# Patient Record
Sex: Female | Born: 1990 | Hispanic: Yes | Smoking: Former smoker
Health system: Southern US, Community
[De-identification: ages and names within clinical notes are randomized; demographics above are authoritative.]

## PROBLEM LIST (undated history)

## (undated) DIAGNOSIS — F419 Anxiety disorder, unspecified: Secondary | ICD-10-CM

## (undated) DIAGNOSIS — F32A Depression, unspecified: Secondary | ICD-10-CM

## (undated) DIAGNOSIS — J45909 Unspecified asthma, uncomplicated: Secondary | ICD-10-CM

## (undated) HISTORY — DX: Depression, unspecified: F32.A

## (undated) HISTORY — PX: FEMUR FRACTURE SURGERY: SHX633

## (undated) HISTORY — PX: THERAPEUTIC ABORTION: SHX798

## (undated) HISTORY — PX: INDUCED ABORTION: SHX677

## (undated) HISTORY — PX: WISDOM TOOTH EXTRACTION: SHX21

## (undated) HISTORY — DX: Unspecified asthma, uncomplicated: J45.909

## (undated) HISTORY — DX: Anxiety disorder, unspecified: F41.9

---
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
## 2008-01-21 ENCOUNTER — Ambulatory Visit: Payer: Self-pay | Admitting: Obstetrics & Gynecology

## 2008-02-18 ENCOUNTER — Ambulatory Visit (HOSPITAL_COMMUNITY): Admission: RE | Admit: 2008-02-18 | Discharge: 2008-02-18 | Payer: Self-pay | Admitting: Obstetrics and Gynecology

## 2008-02-18 ENCOUNTER — Ambulatory Visit: Payer: Self-pay | Admitting: Obstetrics & Gynecology

## 2008-03-31 ENCOUNTER — Ambulatory Visit: Payer: Self-pay | Admitting: Obstetrics & Gynecology

## 2008-03-31 LAB — CONVERTED CEMR LAB: Trich, Wet Prep: NONE SEEN

## 2008-04-14 ENCOUNTER — Ambulatory Visit: Payer: Self-pay | Admitting: Obstetrics & Gynecology

## 2008-04-14 LAB — CONVERTED CEMR LAB
Hemoglobin: 10.8 g/dL — ABNORMAL LOW (ref 12.0–15.0)
MCHC: 32.5 g/dL (ref 30.0–36.0)
RDW: 14.9 % (ref 11.5–15.5)

## 2008-04-16 ENCOUNTER — Ambulatory Visit (HOSPITAL_COMMUNITY): Admission: RE | Admit: 2008-04-16 | Discharge: 2008-04-16 | Payer: Self-pay | Admitting: Family Medicine

## 2008-04-28 ENCOUNTER — Ambulatory Visit: Payer: Self-pay | Admitting: Obstetrics & Gynecology

## 2008-05-12 ENCOUNTER — Ambulatory Visit: Payer: Self-pay | Admitting: Obstetrics & Gynecology

## 2008-05-26 ENCOUNTER — Ambulatory Visit: Payer: Self-pay | Admitting: Family Medicine

## 2008-06-09 ENCOUNTER — Encounter: Payer: Self-pay | Admitting: Obstetrics and Gynecology

## 2008-06-09 ENCOUNTER — Ambulatory Visit: Payer: Self-pay | Admitting: Obstetrics & Gynecology

## 2008-06-11 ENCOUNTER — Ambulatory Visit (HOSPITAL_COMMUNITY): Admission: RE | Admit: 2008-06-11 | Discharge: 2008-06-11 | Payer: Self-pay | Admitting: Obstetrics & Gynecology

## 2008-06-23 ENCOUNTER — Ambulatory Visit: Payer: Self-pay | Admitting: Obstetrics & Gynecology

## 2008-06-23 LAB — CONVERTED CEMR LAB: Chlamydia, DNA Probe: NEGATIVE

## 2008-06-24 ENCOUNTER — Encounter: Payer: Self-pay | Admitting: Obstetrics & Gynecology

## 2008-06-30 ENCOUNTER — Ambulatory Visit: Payer: Self-pay | Admitting: Obstetrics & Gynecology

## 2008-07-01 ENCOUNTER — Ambulatory Visit (HOSPITAL_COMMUNITY): Admission: RE | Admit: 2008-07-01 | Discharge: 2008-07-01 | Payer: Self-pay | Admitting: Obstetrics & Gynecology

## 2008-07-07 ENCOUNTER — Encounter: Payer: Self-pay | Admitting: Obstetrics and Gynecology

## 2008-07-07 ENCOUNTER — Ambulatory Visit: Payer: Self-pay | Admitting: Obstetrics & Gynecology

## 2008-07-17 ENCOUNTER — Ambulatory Visit: Payer: Self-pay | Admitting: Obstetrics & Gynecology

## 2008-07-19 ENCOUNTER — Inpatient Hospital Stay (HOSPITAL_COMMUNITY): Admission: AD | Admit: 2008-07-19 | Discharge: 2008-07-19 | Payer: Self-pay | Admitting: Obstetrics & Gynecology

## 2008-07-19 ENCOUNTER — Ambulatory Visit: Payer: Self-pay | Admitting: Family

## 2008-07-20 ENCOUNTER — Inpatient Hospital Stay (HOSPITAL_COMMUNITY): Admission: AD | Admit: 2008-07-20 | Discharge: 2008-07-20 | Payer: Self-pay | Admitting: Obstetrics & Gynecology

## 2008-07-21 ENCOUNTER — Ambulatory Visit: Payer: Self-pay | Admitting: Obstetrics & Gynecology

## 2008-07-21 ENCOUNTER — Inpatient Hospital Stay (HOSPITAL_COMMUNITY): Admission: AD | Admit: 2008-07-21 | Discharge: 2008-07-25 | Payer: Self-pay | Admitting: Obstetrics & Gynecology

## 2008-07-22 ENCOUNTER — Encounter: Payer: Self-pay | Admitting: Obstetrics & Gynecology

## 2010-01-10 ENCOUNTER — Emergency Department (HOSPITAL_COMMUNITY): Admission: EM | Admit: 2010-01-10 | Discharge: 2010-01-11 | Payer: Self-pay | Admitting: Emergency Medicine

## 2010-04-27 LAB — URINALYSIS, ROUTINE W REFLEX MICROSCOPIC
Glucose, UA: NEGATIVE mg/dL
Hgb urine dipstick: NEGATIVE
Ketones, ur: NEGATIVE mg/dL
Nitrite: NEGATIVE
Protein, ur: NEGATIVE mg/dL
Specific Gravity, Urine: 1.033 — ABNORMAL HIGH (ref 1.005–1.030)
Urobilinogen, UA: 0.2 mg/dL (ref 0.0–1.0)
pH: 5.5 (ref 5.0–8.0)

## 2010-04-27 LAB — POCT PREGNANCY, URINE: Preg Test, Ur: NEGATIVE

## 2010-05-24 LAB — POCT URINALYSIS DIP (DEVICE)
Hgb urine dipstick: NEGATIVE
Ketones, ur: NEGATIVE mg/dL
Protein, ur: 30 mg/dL — AB
Specific Gravity, Urine: 1.02 (ref 1.005–1.030)
Urobilinogen, UA: 0.2 mg/dL (ref 0.0–1.0)
pH: 6.5 (ref 5.0–8.0)

## 2010-05-24 LAB — RPR: RPR Ser Ql: NONREACTIVE

## 2010-05-24 LAB — CBC
HCT: 34.9 % — ABNORMAL LOW (ref 36.0–46.0)
Hemoglobin: 8.6 g/dL — ABNORMAL LOW (ref 12.0–15.0)
MCHC: 33.8 g/dL (ref 30.0–36.0)
MCHC: 34.5 g/dL (ref 30.0–36.0)
MCV: 83.4 fL (ref 78.0–100.0)
Platelets: 138 10*3/uL — ABNORMAL LOW (ref 150–400)
Platelets: 192 10*3/uL (ref 150–400)
RDW: 14.6 % (ref 11.5–15.5)
RDW: 14.8 % (ref 11.5–15.5)
WBC: 10.6 10*3/uL — ABNORMAL HIGH (ref 4.0–10.5)

## 2010-05-25 LAB — POCT URINALYSIS DIP (DEVICE)
Bilirubin Urine: NEGATIVE
Glucose, UA: NEGATIVE mg/dL
Glucose, UA: NEGATIVE mg/dL
Hgb urine dipstick: NEGATIVE
Nitrite: NEGATIVE
Nitrite: NEGATIVE
Protein, ur: 30 mg/dL — AB
Protein, ur: NEGATIVE mg/dL
Urobilinogen, UA: 0.2 mg/dL (ref 0.0–1.0)
Urobilinogen, UA: 0.2 mg/dL (ref 0.0–1.0)
pH: 6.5 (ref 5.0–8.0)

## 2010-05-26 LAB — POCT URINALYSIS DIP (DEVICE)
Bilirubin Urine: NEGATIVE
Glucose, UA: NEGATIVE mg/dL
Glucose, UA: NEGATIVE mg/dL
Nitrite: NEGATIVE
Nitrite: NEGATIVE
Urobilinogen, UA: 0.2 mg/dL (ref 0.0–1.0)

## 2010-05-27 LAB — POCT URINALYSIS DIP (DEVICE)
Bilirubin Urine: NEGATIVE
Bilirubin Urine: NEGATIVE
Glucose, UA: NEGATIVE mg/dL
Glucose, UA: NEGATIVE mg/dL
Glucose, UA: NEGATIVE mg/dL
Hgb urine dipstick: NEGATIVE
Ketones, ur: NEGATIVE mg/dL
Nitrite: NEGATIVE
Protein, ur: 30 mg/dL — AB
Specific Gravity, Urine: 1.02 (ref 1.005–1.030)
Urobilinogen, UA: 0.2 mg/dL (ref 0.0–1.0)

## 2010-05-31 LAB — POCT URINALYSIS DIP (DEVICE)
Bilirubin Urine: NEGATIVE
Ketones, ur: NEGATIVE mg/dL
pH: 5.5 (ref 5.0–8.0)

## 2010-06-01 LAB — POCT URINALYSIS DIP (DEVICE)
Glucose, UA: NEGATIVE mg/dL
Ketones, ur: NEGATIVE mg/dL
Protein, ur: 30 mg/dL — AB
Specific Gravity, Urine: 1.015 (ref 1.005–1.030)

## 2010-06-29 NOTE — Op Note (Signed)
NAMEJELENA, Dawn Richmond                ACCOUNT NO.:  [id number]   MEDICAL RECORD NO.:  [id number]           PATIENT TYPE:   LOCATION:                                 FACILITY:   PHYSICIAN:  Dawn Richmond, M.D. DATE OF BIRTH:  1990-11-18   DATE OF PROCEDURE:  07/22/2008  DATE OF DISCHARGE:                               OPERATIVE REPORT   PREOPERATIVE DIAGNOSES:  1. Intrauterine pregnancy at 22-4/7 weeks' gestational age.  2. Arrest of descent.   POSTOPERATIVE DIAGNOSES:  1. Intrauterine pregnancy at 22-4/7 weeks' gestational age.  2. Arrest of descent.  3. Chorioamnionitis.   PROCEDURE:  Primary low transverse cesarean section.   SURGEON:  Dawn Richmond, M.D.   ASSISTANTS:  Dawn Sera, DO and Dr. Londell Richmond.   INDICATIONS FOR PROCEDURE:  [name] is a 20 year old gravida 1  now para 1-0-0-1, who was admitted in the early morning hours of July 22, 2008, in active labor.  Her labor progressed quite slowly and was  remarkable for an extended period of time in latent labor.  Her labor  eventually picked up and she dilated to complete and complete, and after  over 2-1/2 hours of pushing, she was unable to push the baby passed +2  station.  The patient was counseled on the risks and benefits of vacuum  versus cesarean section and the patient desired to proceed with a  cesarean section.  The patient was counseled on the risks of this  procedure to include but not limited to bleeding, infection, and damage  to internal organs.  The patient voiced understanding of these risks and  desired to proceed.   DESCRIPTION OF PROCEDURE:  The patient was taken to the operating room  where her epidural anesthesia was free bolus.  She was placed in the  left dorsal supine position and prepped and draped in the usual sterile  manner.  A time-out was conducted.  Appropriate anesthesia was  confirmed.  Pfannenstiel incision was made in the skin and continued  through the  subcutaneous layers down to the fascia.  The fascia was then  incised in midline.  The fascial incision was extended using manual  traction.  The fascia was then bluntly and sharply dissected off the  underlying rectus muscles.  The rectus muscles were then entered bluntly  and separated with manual traction.  The peritoneum was then entered  bluntly and narrow opening also was separated with manual traction.  Appropriate opening to the uterus was obtained.  The bladder blade was  placed.  A transverse incision was made with a scalpel in the lower  uterine segment, extended through the myometrial layers till clear  amniotic fluid was noted.  The uterine incision was then extended  laterally using manual traction.  The fetal head was grasped, was well  engaging the pelvis, and with the assistance of a vaginal operator, the  head was elevated out of the pelvis and delivered through the uterine  incision with the assistance of fundal pressure.  The mouth and nares  were bulb suctioned, then the shoulders,  followed by rest of corpus were  delivered without difficulty.  There was a spontaneous cry and good  color and good tone.  The mouth and nares were bulb suctioned again.  The cord was clamped and cut and the baby handed to the awaiting NICU  staff.  The placenta was then delivered from the uterus with the  assistance of fundal massage.  The uterus was then cleared of clots and  debris using a dry lap sponge.  The uterine incision was closed in 2-  layer closure using 0-Vicryl.  The first layer was a running  interlocking layer.  The second layer was an imbricating layer.  Of  note, the uterus was exteriorized during this closure.  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  However, there was no evidence of any uterine  septum and no evidence of any uterine anomaly during our inspection.  I  repeat, there is no evidence of a  bicornate uterus per our inspection.  Good hemostasis was noted of the uterine closure and the uterus was then  returned to the abdomen.  The abdomen was then irrigated with sterile  water.  Good hemostasis was again noted.  The peritoneum was then closed  using 2-0 Vicryl in a running non-interlocking fashion.  The fascia was  then closed using 0-Vicryl in a running non-interlocking fashion.  Subcutaneous tissues were irrigated with a wet lap sponge and bleeding  was controlled with electrocautery.  The skin was then closed with  staples in the usual manner.  All sponge, instrument, and needle counts  were correct x2.  A pressure dressing was applied.   ANESTHESIA:  Epidural.   FINDINGS:  1. Clear amniotic fluid.  2. Viable female infant.  3. Normal uterus--no evidence of bicornuate uterus or septum.   SPECIMEN:  Placenta.   DISPOSITION:  To Pathology.   ESTIMATED BLOOD LOSS:  1000 mL.   There were no immediate complications.  The patient was taken to the  PACU in good condition.      Dawn Sera, DO  Electronically Signed     ______________________________  Dawn Richmond, M.D.    MC/MEDQ  D:  07/22/2008  T:  07/23/2008  Job:  [id number]

## 2010-06-29 NOTE — Discharge Summary (Signed)
NAMEMARAYAH, Dawn Richmond                ACCOUNT NO.:  [id number]   MEDICAL RECORD NO.:  [id number]          PATIENT TYPE:  WOC   LOCATION:  WOC                          FACILITY:  [facility]   PHYSICIAN:  Dawn Richmond, M.D. DATE OF BIRTH:  [birth year]   DATE OF ADMISSION:  07/21/2008  DATE OF DISCHARGE:  07/25/2008                               DISCHARGE SUMMARY   PRIMARY DIAGNOSES:  1. Intrauterine pregnancy at 40 weeks and 4 days.  2. Status post low-transverse cesarean section, primary.  3. Anemia, mild.   SECONDARY DIAGNOSIS:  Remote history of asthma.   DIAGNOSTIC STUDIES:  Prenatal labs:  O positive, antibody negative,  rubella immune, hepatitis B surface antigen negative, RPR nonreactive,  HIV negative, gonorrhea and chlamydia negative x2, GBS negative.  On Jun 23, 2008, 1 hour glucose of 69.  Postpartum hemoglobin 8.6.   HOSPITAL COURSE:  [name] is a 20 year old G1, P1-0-0-1 who  presented at 55 and 3 weeks with onset of spontaneous labor.  She was  found to be 3, 70, and -2 on exam.  On admission, she had had  uncomplicated prenatal course.  She was thought to have a bicornuate  uterus, although this is not noted during her cesarean section.  She  progressed well, had Pitocin augmentation, and AROM was performed.  An  IUPC was placed during her labor.  She was fully dilated but pushed for  2 hours 15 minutes and still at the +2 station.  She was offered vacuum  but declined and opted for C-section instead.  Risks and benefits were  discussed with the patient.  The patient went to section for arrest of  descent.  During section, she delivered a healthy female infant.  Her  placenta was sent to Pathology.  She was not found to have a bicornuate  uterus on inspection.  Infant Apgars were 8 and 9.  The infant and  mother were stable and sent to [facility] unit.  She did well  postpartum.  Her staples are to be removed prior to discharge.  She has  been eating,  ambulating, and going to bathroom normally.  Her lochia is  less than menses, and her pain is well controlled.  She will be  discharged home today on postpartum day #3 with followup with the health  department in 6 weeks and with Depo prior to discharge.   DISCHARGE MEDICATIONS:  1. Iron sulfate 325 mg 1 p.o. b.i.d.  2. Colace 100 mg p.o. b.i.d.  3. Percocet 5/325 1 p.o. q.4 h. p.r.n. pain.  4. Ibuprofen 600 mg p.o. q.6 h. p.r.n. pain.  5. Depo-Provera 150 mg IM once prior to discharge.   DISCHARGE INSTRUCTIONS:  DISPOSITION:  Discharged home.  DIET:  Regular.  ACTIVITY:  No intercourse for 6 weeks, otherwise unlimited.  FOLLOWUP:  With the health department in 6 weeks.  Call for increasing  pain, bleeding, drainage from incision site, erythema around incision  site, headaches, vision, changes, shortness of breath, lightheadedness,  chest pain, or foul-smelling discharge.     ______________________________  Obstetrics Resident  Dawn Richmond, M.D.  Electronically Signed    OR/MEDQ  D:  07/25/2008  T:  07/25/2008  Job:  [id number]

## 2010-11-16 LAB — HCG, QUANTITATIVE, PREGNANCY: hCG, Beta Chain, Quant, S: 50422 — ABNORMAL HIGH

## 2010-11-16 LAB — WET PREP, GENITAL
Clue Cells Wet Prep HPF POC: NONE SEEN
Trich, Wet Prep: NONE SEEN

## 2010-11-16 LAB — CBC
HCT: 36.2
Hemoglobin: 11.9 — ABNORMAL LOW
MCV: 81.7
Platelets: 330
WBC: 7.9

## 2010-11-16 LAB — PREGNANCY, URINE: Preg Test, Ur: POSITIVE

## 2010-11-16 LAB — URINALYSIS, ROUTINE W REFLEX MICROSCOPIC
Bilirubin Urine: NEGATIVE
Hgb urine dipstick: NEGATIVE
Ketones, ur: 15 — AB
Nitrite: NEGATIVE
Nitrite: NEGATIVE
Specific Gravity, Urine: 1.031 — ABNORMAL HIGH
Urobilinogen, UA: 0.2
pH: 6
pH: 7

## 2010-11-16 LAB — GC/CHLAMYDIA PROBE AMP, GENITAL: GC Probe Amp, Genital: NEGATIVE

## 2010-11-16 LAB — RPR: RPR Ser Ql: NONREACTIVE

## 2010-11-19 LAB — POCT URINALYSIS DIP (DEVICE)
Bilirubin Urine: NEGATIVE
Hgb urine dipstick: NEGATIVE
Protein, ur: 100 mg/dL — AB
pH: 6 (ref 5.0–8.0)

## 2011-09-13 ENCOUNTER — Encounter (HOSPITAL_COMMUNITY): Payer: Self-pay | Admitting: Emergency Medicine

## 2011-09-13 DIAGNOSIS — O2690 Pregnancy related conditions, unspecified, unspecified trimester: Secondary | ICD-10-CM | POA: Insufficient documentation

## 2011-09-13 LAB — CBC WITH DIFFERENTIAL/PLATELET
Basophils Relative: 0 % (ref 0–1)
Eosinophils Absolute: 0.1 10*3/uL (ref 0.0–0.7)
Eosinophils Relative: 2 % (ref 0–5)
HCT: 36.1 % (ref 36.0–46.0)
Hemoglobin: 12.1 g/dL (ref 12.0–15.0)
Lymphs Abs: 3 10*3/uL (ref 0.7–4.0)
MCH: 26.6 pg (ref 26.0–34.0)
MCHC: 33.5 g/dL (ref 30.0–36.0)
MCV: 79.3 fL (ref 78.0–100.0)
Monocytes Absolute: 0.8 10*3/uL (ref 0.1–1.0)
Monocytes Relative: 10 % (ref 3–12)
Neutrophils Relative %: 50 % (ref 43–77)
RBC: 4.55 MIL/uL (ref 3.87–5.11)

## 2011-09-13 LAB — BASIC METABOLIC PANEL
BUN: 6 mg/dL (ref 6–23)
Creatinine, Ser: 0.54 mg/dL (ref 0.50–1.10)
GFR calc Af Amer: >90 mL/min (ref >90)
GFR calc non Af Amer: >90 mL/min (ref >90)
Glucose, Bld: 93 mg/dL (ref 70–99)
Potassium: 4.2 mEq/L (ref 3.5–5.1)

## 2011-09-13 NOTE — ED Notes (Addendum)
PT. REPORTS VAGINAL PAIN / LOW ABDOMINAL CRAMPING WITH  BROWNISH VAGINAL DISCHARGE TODAY , CONCERNED ABOUT MISCARRIAGE - LMP - June 18 , 2013 .

## 2011-09-14 ENCOUNTER — Emergency Department (HOSPITAL_COMMUNITY)
Admission: EM | Admit: 2011-09-14 | Discharge: 2011-09-14 | Disposition: A | Discharge status: Home / Self Care | Payer: Medicaid Other | Attending: Emergency Medicine | Admitting: Emergency Medicine

## 2011-09-14 ENCOUNTER — Emergency Department (HOSPITAL_COMMUNITY): Payer: Medicaid Other

## 2011-09-14 DIAGNOSIS — O209 Hemorrhage in early pregnancy, unspecified: Secondary | ICD-10-CM

## 2011-09-14 LAB — URINALYSIS, ROUTINE W REFLEX MICROSCOPIC
Bilirubin Urine: NEGATIVE
Glucose, UA: NEGATIVE mg/dL
Ketones, ur: NEGATIVE mg/dL
Nitrite: NEGATIVE
Specific Gravity, Urine: 1.02 (ref 1.005–1.030)
pH: 6 (ref 5.0–8.0)

## 2011-09-14 LAB — URINE MICROSCOPIC-ADD ON

## 2011-09-14 LAB — ABO/RH

## 2011-09-14 LAB — HCG, QUANTITATIVE, PREGNANCY: hCG, Beta Chain, Quant, S: 29843 m[IU]/mL — ABNORMAL HIGH (ref <5)

## 2011-09-14 MED ORDER — PRENATAL COMPLETE 14-0.4 MG PO TABS: 1.0000 | ORAL_TABLET | Freq: Every day | ORAL | Status: DC

## 2011-09-14 NOTE — ED Notes (Signed)
Dr. Norlene Campbell cancelled wet prep. Clicked off wrong procedure.

## 2011-09-14 NOTE — ED Notes (Signed)
MD at bedside. 

## 2011-09-15 LAB — GC/CHLAMYDIA PROBE AMP, GENITAL: Chlamydia, DNA Probe: NEGATIVE

## 2011-09-19 NOTE — ED Provider Notes (Signed)
History     CSN: [id number]  Arrival date & time 09/13/11  2114   First MD Initiated Contact with Patient 09/14/11 0056      Chief Complaint  Patient presents with  . Vaginal Discharge    (Consider location/radiation/quality/duration/timing/severity/associated sxs/prior treatment) HPI 21 yo female g2p1 at approximately 6 weeks with lmp of 6/18.  Pt reports she had high risk first pregnancy due to being misdiagnosed with bicornate uterus.  Pt has fibroid, found at c/s with first pregnancy.  She has had brownish discharge and low back pain since yesterday.  She is concerned about miscarriage.  Pt has not yet seen ob this pregnancy.   History reviewed. No pertinent past medical history.  History reviewed. No pertinent past surgical history.  No family history on file.  History  Substance Use Topics  . Smoking status: Never Smoker   . Smokeless tobacco: Not on file  . Alcohol Use: No    OB History    Grav Para Term Preterm Abortions TAB SAB Ect Mult Living                  Review of Systems  All other systems reviewed and are negative.    Allergies  Review of patient's allergies indicates no known allergies.  Home Medications   Current Outpatient Rx  Name Route Sig Dispense Refill  . PRENATAL COMPLETE 14-0.4 MG PO TABS Oral Take 1 tablet by mouth daily. 60 each 0    BP 107/71  Pulse 65  Temp 98.1 F (36.7 C) (Oral)  Resp 16  SpO2 99%  LMP 08/02/2011  Physical Exam  Nursing note and vitals reviewed. Constitutional: She is oriented to person, place, and time. She appears well-developed and well-nourished.  HENT:  Head: Normocephalic and atraumatic.  Nose: Nose normal.  Mouth/Throat: Oropharynx is clear and moist.  Eyes: Conjunctivae and EOM are normal. Pupils are equal, round, and reactive to light.  Neck: Normal range of motion. Neck supple. No JVD present. No tracheal deviation present. No thyromegaly present.  Cardiovascular: Normal rate, regular  rhythm, normal heart sounds and intact distal pulses.  Exam reveals no gallop and no friction rub.   No murmur heard. Pulmonary/Chest: Effort normal and breath sounds normal. No stridor. No respiratory distress. She has no wheezes. She has no rales. She exhibits no tenderness.  Abdominal: Soft. Bowel sounds are normal. She exhibits no distension and no mass. There is no tenderness. There is no rebound and no guarding.  Genitourinary: Vaginal discharge (brown d/c) found.       Os bluish,appears closed, dark brown discharge with some mucus.  Digital exam with slight dilation to cervix  Musculoskeletal: Normal range of motion. She exhibits no edema and no tenderness.  Lymphadenopathy:    She has no cervical adenopathy.  Neurological: She is alert and oriented to person, place, and time. She exhibits normal muscle tone. Coordination normal.  Skin: Skin is dry. No rash noted. No erythema. No pallor.  Psychiatric: She has a normal mood and affect. Her behavior is normal. Judgment and thought content normal.    ED Course  Procedures (including critical care time)  Results for orders placed during the hospital encounter of 09/14/11  URINALYSIS, ROUTINE W REFLEX MICROSCOPIC      Component Value Range   Color, Urine YELLOW  YELLOW   APPearance CLOUDY (*) CLEAR   Specific Gravity, Urine 1.020  1.005 - 1.030   pH 6.0  5.0 - 8.0   Glucose, UA  NEGATIVE  NEGATIVE mg/dL   Hgb urine dipstick MODERATE (*) NEGATIVE   Bilirubin Urine NEGATIVE  NEGATIVE   Ketones, ur NEGATIVE  NEGATIVE mg/dL   Protein, ur NEGATIVE  NEGATIVE mg/dL   Urobilinogen, UA 0.2  0.0 - 1.0 mg/dL   Nitrite NEGATIVE  NEGATIVE   Leukocytes, UA SMALL (*) NEGATIVE  CBC WITH DIFFERENTIAL      Component Value Range   WBC 7.8  4.0 - 10.5 K/uL   RBC 4.55  3.87 - 5.11 MIL/uL   Hemoglobin 12.1  12.0 - 15.0 g/dL   HCT 16.1  09.6 - 04.5 %   MCV 79.3  78.0 - 100.0 fL   MCH 26.6  26.0 - 34.0 pg   MCHC 33.5  30.0 - 36.0 g/dL   RDW 40.9   81.1 - 91.4 %   Platelets 291  150 - 400 K/uL   Neutrophils Relative 50  43 - 77 %   Neutro Abs 3.9  1.7 - 7.7 K/uL   Lymphocytes Relative 39  12 - 46 %   Lymphs Abs 3.0  0.7 - 4.0 K/uL   Monocytes Relative 10  3 - 12 %   Monocytes Absolute 0.8  0.1 - 1.0 K/uL   Eosinophils Relative 2  0 - 5 %   Eosinophils Absolute 0.1  0.0 - 0.7 K/uL   Basophils Relative 0  0 - 1 %   Basophils Absolute 0.0  0.0 - 0.1 K/uL  BASIC METABOLIC PANEL      Component Value Range   Sodium 137  135 - 145 mEq/L   Potassium 4.2  3.5 - 5.1 mEq/L   Chloride 102  96 - 112 mEq/L   CO2 27  19 - 32 mEq/L   Glucose, Bld 93  70 - 99 mg/dL   BUN 6  6 - 23 mg/dL   Creatinine, Ser 7.82  0.50 - 1.10 mg/dL   Calcium 95.6  8.4 - 21.3 mg/dL   GFR calc non Af Amer >90  >90 mL/min   GFR calc Af Amer >90  >90 mL/min  POCT PREGNANCY, URINE      Component Value Range   Preg Test, Ur POSITIVE (*) NEGATIVE  GC/CHLAMYDIA PROBE AMP, GENITAL      Component Value Range   GC Probe Amp, Genital NEGATIVE  NEGATIVE   Chlamydia, DNA Probe NEGATIVE  NEGATIVE  HCG, QUANTITATIVE, PREGNANCY      Component Value Range   hCG, Beta Chain, Quant, S [id number] (*) <5 mIU/mL  ABO/RH      Component Value Range   ABO/RH(D) O POS     No rh immune globuloin NOT A RH IMMUNE GLOBULIN CANDIDATE, PT RH POSITIVE    URINE MICROSCOPIC-ADD ON      Component Value Range   Squamous Epithelial / LPF RARE  RARE   WBC, UA 3-6  <3 WBC/hpf   RBC / HPF 3-6  <3 RBC/hpf   Bacteria, UA MANY (*) RARE   Crystals CA OXALATE CRYSTALS (*) NEGATIVE   US Ob Comp Less 14 Wks  09/14/2011  *RADIOLOGY REPORT*  Clinical Data: Vaginal bleeding, pregnant.  OBSTETRIC <14 WK [place] AND TRANSVAGINAL OB US  Technique:  Both transabdominal and transvaginal ultrasound examinations were performed for complete evaluation of the gestation as well as the maternal uterus, adnexal regions, and pelvic cul-de-sac.  Transvaginal technique was performed to assess early pregnancy.  Comparison:   None.  Intrauterine gestational sac:  Visualized/normal in shape. Yolk sac: Identified  Embryo: Identified Cardiac Activity: Identified Heart Rate: 123 bpm  CRL: 3.4  mm  6 w  0 d        [place] EDC: 05/09/2012  Maternal uterus/adnexae: Normal sonographic appearance to the ovaries.  No significant subchorionic hemorrhage.  No free fluid.  IMPRESSION: Single intrauterine gestation with cardiac activity documented. Estimated age of 6 weeks 0 days by crown-rump length.  Original Report Authenticated By: Waneta Martins, M.D.   US Ob Transvaginal  09/14/2011  *RADIOLOGY REPORT*  Clinical Data: Vaginal bleeding, pregnant.  OBSTETRIC <14 WK [place] AND TRANSVAGINAL OB US  Technique:  Both transabdominal and transvaginal ultrasound examinations were performed for complete evaluation of the gestation as well as the maternal uterus, adnexal regions, and pelvic cul-de-sac.  Transvaginal technique was performed to assess early pregnancy.  Comparison:  None.  Intrauterine gestational sac:  Visualized/normal in shape. Yolk sac: Identified Embryo: Identified Cardiac Activity: Identified Heart Rate: 123 bpm  CRL: 3.4  mm  6 w  0 d        [place] EDC: 05/09/2012  Maternal uterus/adnexae: Normal sonographic appearance to the ovaries.  No significant subchorionic hemorrhage.  No free fluid.  IMPRESSION: Single intrauterine gestation with cardiac activity documented. Estimated age of 6 weeks 0 days by crown-rump length.  Original Report Authenticated By: Waneta Martins, M.D.      1. First trimester bleeding       MDM  20 yo female with first trimester bleeding, but viable appearing gestation on u/s.  Pt updated on findings and need for close follow up with ob        Olivia Mackie, MD 09/19/11 2127

## 2012-01-24 DIAGNOSIS — N949 Unspecified condition associated with female genital organs and menstrual cycle: Secondary | ICD-10-CM | POA: Insufficient documentation

## 2012-01-24 DIAGNOSIS — Z3202 Encounter for pregnancy test, result negative: Secondary | ICD-10-CM | POA: Insufficient documentation

## 2012-01-24 DIAGNOSIS — N72 Inflammatory disease of cervix uteri: Secondary | ICD-10-CM | POA: Insufficient documentation

## 2012-01-25 ENCOUNTER — Emergency Department (HOSPITAL_COMMUNITY)

## 2012-01-25 ENCOUNTER — Encounter (HOSPITAL_COMMUNITY): Payer: Self-pay | Admitting: *Deleted

## 2012-01-25 ENCOUNTER — Emergency Department (HOSPITAL_COMMUNITY)
Admission: EM | Admit: 2012-01-25 | Discharge: 2012-01-25 | Disposition: A | Discharge status: Home / Self Care | Attending: Emergency Medicine | Admitting: Emergency Medicine

## 2012-01-25 DIAGNOSIS — R102 Pelvic and perineal pain: Secondary | ICD-10-CM

## 2012-01-25 DIAGNOSIS — N72 Inflammatory disease of cervix uteri: Secondary | ICD-10-CM

## 2012-01-25 LAB — BASIC METABOLIC PANEL
BUN: 6 mg/dL (ref 6–23)
Calcium: 9.4 mg/dL (ref 8.4–10.5)
Creatinine, Ser: 0.66 mg/dL (ref 0.50–1.10)
GFR calc Af Amer: >90 mL/min (ref >90)
GFR calc non Af Amer: >90 mL/min (ref >90)
Glucose, Bld: 107 mg/dL — ABNORMAL HIGH (ref 70–99)
Potassium: 3.4 mEq/L — ABNORMAL LOW (ref 3.5–5.1)

## 2012-01-25 LAB — URINALYSIS, ROUTINE W REFLEX MICROSCOPIC
Bilirubin Urine: NEGATIVE
Hgb urine dipstick: NEGATIVE
Ketones, ur: NEGATIVE mg/dL
Specific Gravity, Urine: 1.004 — ABNORMAL LOW (ref 1.005–1.030)
pH: 6 (ref 5.0–8.0)

## 2012-01-25 LAB — CBC WITH DIFFERENTIAL/PLATELET
Basophils Relative: 1 % (ref 0–1)
Eosinophils Absolute: 0.2 10*3/uL (ref 0.0–0.7)
Eosinophils Relative: 3 % (ref 0–5)
Hemoglobin: 12.7 g/dL (ref 12.0–15.0)
Lymphs Abs: 2.1 10*3/uL (ref 0.7–4.0)
MCH: 25.8 pg — ABNORMAL LOW (ref 26.0–34.0)
MCHC: 33 g/dL (ref 30.0–36.0)
MCV: 78.1 fL (ref 78.0–100.0)
Monocytes Absolute: 0.7 10*3/uL (ref 0.1–1.0)
Monocytes Relative: 12 % (ref 3–12)
Neutrophils Relative %: 49 % (ref 43–77)
RBC: 4.93 MIL/uL (ref 3.87–5.11)

## 2012-01-25 LAB — PREGNANCY, URINE: Preg Test, Ur: NEGATIVE

## 2012-01-25 LAB — WET PREP, GENITAL: Clue Cells Wet Prep HPF POC: NONE SEEN

## 2012-01-25 MED ORDER — METOCLOPRAMIDE HCL 10 MG PO TABS: 10.0000 mg | ORAL_TABLET | Freq: Three times a day (TID) | ORAL | Status: DC | PRN

## 2012-01-25 MED ORDER — DOXYCYCLINE HYCLATE 100 MG PO CPEP: 100.0000 mg | ORAL_CAPSULE | Freq: Two times a day (BID) | ORAL | Status: AC

## 2012-01-25 MED ORDER — IBUPROFEN 800 MG PO TABS
800.0000 mg | ORAL_TABLET | Freq: Once | ORAL | Status: AC
  Administered 2012-01-25: 800 mg via ORAL
  Filled 2012-01-25: qty 1

## 2012-01-25 MED ORDER — SODIUM CHLORIDE 0.9 % IV SOLN
Freq: Once | INTRAVENOUS | Status: AC
  Administered 2012-01-25: 20 mL/h via INTRAVENOUS

## 2012-01-25 MED ORDER — IOHEXOL 300 MG/ML  SOLN: 100.0000 mL | Freq: Once | INTRAMUSCULAR | Status: DC | PRN

## 2012-01-25 MED ORDER — TRAMADOL HCL 50 MG PO TABS: 50.0000 mg | ORAL_TABLET | Freq: Four times a day (QID) | ORAL | Status: DC | PRN

## 2012-01-25 MED ORDER — CEFACLOR 500 MG PO CAPS: 500.0000 mg | ORAL_CAPSULE | Freq: Three times a day (TID) | ORAL | Status: AC

## 2012-01-25 MED ORDER — MORPHINE SULFATE 4 MG/ML IJ SOLN
4.0000 mg | Freq: Once | INTRAMUSCULAR | Status: AC
  Administered 2012-01-25: 4 mg via INTRAVENOUS
  Filled 2012-01-25: qty 1

## 2012-01-25 MED ORDER — METOCLOPRAMIDE HCL 5 MG/ML IJ SOLN
10.0000 mg | Freq: Once | INTRAMUSCULAR | Status: AC
  Administered 2012-01-25: 10 mg via INTRAVENOUS
  Filled 2012-01-25: qty 2

## 2012-01-25 NOTE — ED Notes (Signed)
Pt alert and oriented x4. Respirations even and unlabored, bilateral symmetrical rise and fall of chest. Skin warm and dry. In no acute distress. Denies needs.   

## 2012-01-25 NOTE — ED Notes (Signed)
Pt escorted to discharge window. Pt verbalized understanding discharge instructions. In no acute distress.  

## 2012-01-25 NOTE — ED Notes (Signed)
Pt here with c/o rt inguinal pain that began last month. Pt went to the clinic in [place] and was given a pelvic exam and cultured. Pt was placed on antibiotics, Doxycycline and Flagyl. Pt has finished the Flagyl and is still taking the Doxycycline. Pt says std panel came back negative but was told she had PID. Pt began to miscarry July and went to abortion clinic in [place] August 3rd to terminally end the pregnancy. Pt states she and her husband began to have intercourse 4-5 days after the pregnancy ended. Patient not currently taking any pain medications.

## 2012-01-25 NOTE — ED Provider Notes (Signed)
History     CSN: [id number]  Arrival date & time 01/24/12  2356   First MD Initiated Contact with Patient 01/25/12 0134      Chief Complaint  Patient presents with  . Groin Pain    (Consider location/radiation/quality/duration/timing/severity/associated sxs/prior treatment) HPI 21 year old female presents emergency apartment complaining of persistent right lower quadrant pain. She's had these symptoms ongoing for the last 2 weeks, starting 11/19.   She was seen at [facility] in [place], diagnosed with PID on 12/3. She reports she was treated with doxycycline and Flagyl. She has a few more days of doxycycline left. She reports she was called by the clinic and told that her cultures were negative for gonorrhea and Chlamydia. She denies any fevers or chills no dysuria. Patient had miscarriage in July, had D&C August 3. She has had an irregular period since that time. She had a short menstrual cycle after being seen and [facility] that lasted 3 days. She has not had intercourse with anyone since being treated. She reports the pain is constant, worse with movement.    History reviewed. No pertinent past medical history.  Past Surgical History  Procedure Date  . Induced abortion   . Therapeutic abortion august 3rd 2013    History reviewed. No pertinent family history.  History  Substance Use Topics  . Smoking status: Never Smoker   . Smokeless tobacco: Not on file  . Alcohol Use: Yes     Comment: occasional    OB History    Grav Para Term Preterm Abortions TAB SAB Ect Mult Living                  Review of Systems  See History of Present Illness; otherwise all other systems are reviewed and negative  Allergies  Review of patient's allergies indicates no known allergies.  Home Medications   Current Outpatient Rx  Name  Route  Sig  Dispense  Refill  . DOXYCYCLINE HYCLATE 100 MG PO CPEP   Oral   Take 100 mg by mouth 2 (two) times daily. For 10 days only  Stop  date:01/27/12         . METRONIDAZOLE 500 MG PO TABS   Oral   Take 500 mg by mouth 3 (three) times daily. For 7 days only    Stop date: 01/24/12           BP 117/71  Pulse 90  Temp 98.1 F (36.7 C) (Oral)  Resp 20  SpO2 100%  LMP 01/17/2012  Physical Exam  Nursing note and vitals reviewed. Constitutional: She is oriented to person, place, and time. She appears well-developed and well-nourished.  HENT:  Head: Normocephalic and atraumatic.  Nose: Nose normal.  Mouth/Throat: Oropharynx is clear and moist.  Eyes: Conjunctivae normal and EOM are normal. Pupils are equal, round, and reactive to light.  Neck: Normal range of motion. Neck supple. No JVD present. No tracheal deviation present. No thyromegaly present.  Cardiovascular: Normal rate, regular rhythm, normal heart sounds and intact distal pulses.  Exam reveals no gallop and no friction rub.   No murmur heard. Pulmonary/Chest: Effort normal and breath sounds normal. No stridor. No respiratory distress. She has no wheezes. She has no rales. She exhibits no tenderness.  Abdominal: Soft. Bowel sounds are normal. She exhibits no distension and no mass. There is tenderness (tenderness to palpation over right lower abdomen). There is no rebound and no guarding.  Genitourinary:       Yellow-green  discharge noted in vault. Patient with significant cervical motion tenderness. Right adnexal tenderness. Less tender over uterus, no adnexal tenderness on the left. No masses appreciated, external genitalia normal  Musculoskeletal: Normal range of motion. She exhibits no edema and no tenderness.  Lymphadenopathy:    She has no cervical adenopathy.  Neurological: She is oriented to person, place, and time. She has normal reflexes. No cranial nerve deficit. She exhibits normal muscle tone. Coordination normal.  Skin: Skin is warm and dry. No rash noted. No erythema. No pallor.  Psychiatric: She has a normal mood and affect. Her behavior is  normal. Judgment and thought content normal.    ED Course  Procedures (including critical care time)  Labs Reviewed  URINALYSIS, ROUTINE W REFLEX MICROSCOPIC - Abnormal; Notable for the following:    Specific Gravity, Urine 1.004 (*)     All other components within normal limits  WET PREP, GENITAL - Abnormal; Notable for the following:    WBC, Wet Prep HPF POC MODERATE (*)     All other components within normal limits  CBC WITH DIFFERENTIAL - Abnormal; Notable for the following:    MCH 25.8 (*)     Platelets 462 (*)     All other components within normal limits  BASIC METABOLIC PANEL - Abnormal; Notable for the following:    Potassium 3.4 (*)     Glucose, Bld 107 (*)     All other components within normal limits  PREGNANCY, URINE  GC/CHLAMYDIA PROBE AMP   US Transvaginal Non-ob  01/25/2012  *RADIOLOGY REPORT*  Clinical Data: Right adnexal pain.  TRANSABDOMINAL AND TRANSVAGINAL ULTRASOUND OF PELVIS Technique:  Both transabdominal and transvaginal ultrasound examinations of the pelvis were performed. Transabdominal technique was performed for global imaging of the pelvis including uterus, ovaries, adnexal regions, and pelvic cul-de-sac.  It was necessary to proceed with endovaginal exam following the transabdominal exam to visualize the ovaries and endometrium.  Comparison:  None  Findings:  Uterus: The uterus is anteverted and measures 8.2 x 4.6 x 6 cm.  No focal myometrial masses.  Cystic changes in the cervix consistent with Nabothian cysts.  Endometrium: Normal endometrial stripe thickness at 5.5 mm.  No abnormal endometrial fluid collection.  Right ovary:  The right ovary measures 3.1 x 2.5 x 2 cm.  Normal follicular changes.  Flow is demonstrated in the right ovary on color flow Doppler imaging.  Left ovary: The left ovary measures 2.8 x 1.1 x 2.4 cm.  Normal follicular changes.  Other findings: No free fluid  IMPRESSION: Normal study. No evidence of pelvic mass or other significant  abnormality.   Original Report Authenticated By: Burman Nieves, M.D.    US Pelvis Complete  01/25/2012  *RADIOLOGY REPORT*  Clinical Data: Right adnexal pain.  TRANSABDOMINAL AND TRANSVAGINAL ULTRASOUND OF PELVIS Technique:  Both transabdominal and transvaginal ultrasound examinations of the pelvis were performed. Transabdominal technique was performed for global imaging of the pelvis including uterus, ovaries, adnexal regions, and pelvic cul-de-sac.  It was necessary to proceed with endovaginal exam following the transabdominal exam to visualize the ovaries and endometrium.  Comparison:  None  Findings:  Uterus: The uterus is anteverted and measures 8.2 x 4.6 x 6 cm.  No focal myometrial masses.  Cystic changes in the cervix consistent with Nabothian cysts.  Endometrium: Normal endometrial stripe thickness at 5.5 mm.  No abnormal endometrial fluid collection.  Right ovary:  The right ovary measures 3.1 x 2.5 x 2 cm.  Normal follicular changes.  Flow is demonstrated in the right ovary on color flow Doppler imaging.  Left ovary: The left ovary measures 2.8 x 1.1 x 2.4 cm.  Normal follicular changes.  Other findings: No free fluid  IMPRESSION: Normal study. No evidence of pelvic mass or other significant abnormality.   Original Report Authenticated By: Burman Nieves, M.D.      1. Pelvic pain   2. Cervicitis       MDM  21 yo female presents to the emergency department with persistent vaginal discharge and right lower quadrant pain. Initially thought to have perhaps ovarian cyst, no abnormality noted on ultrasound. Pelvic exam with significant purulent discharge and very tender with cervical motion. Discussed case with on-call OB/GYN, Dr. Tinnie Gens who recommends CT scan, and possible transfer to women's.        Olivia Mackie, MD 01/25/12 [phone]

## 2012-01-25 NOTE — ED Notes (Signed)
Per md pt allowed to take home antibiotics

## 2012-01-25 NOTE — ED Provider Notes (Signed)
Patient given update on CT results - unremarkable.  She was resting, seemingly comfortably.  She tells me that she has f/u tomorrow.  We will also provided Gyn F/U and the patient will be d/c.   Gerhard Munch, MD 01/25/12 [phone]

## 2012-01-25 NOTE — ED Notes (Signed)
Pt back from CT

## 2012-01-25 NOTE — ED Notes (Signed)
Pt to US with tech

## 2012-01-25 NOTE — ED Notes (Signed)
md at bedside

## 2012-01-25 NOTE — ED Notes (Signed)
Pt states pelvic cramping pain began on 11/29 when she was supposed to start her cycle.  Pt stated she thought she was having another miscarriage but at [facility] the pregnancy test was negative.  Pt stated she began her cycle Dec 3 and stopped on Sunday, which is shorter than usual.  Pt states pain is increasing but she was unable to fill her perscription. Pt states she finished her Flagyl today and is still taking Doxycycline.

## 2012-01-26 LAB — GC/CHLAMYDIA PROBE AMP
CT Probe RNA: NEGATIVE
GC Probe RNA: NEGATIVE

## 2012-05-21 ENCOUNTER — Encounter: Payer: Self-pay | Admitting: Obstetrics & Gynecology

## 2012-08-02 ENCOUNTER — Ambulatory Visit (INDEPENDENT_AMBULATORY_CARE_PROVIDER_SITE_OTHER): Admitting: Obstetrics & Gynecology

## 2012-08-02 ENCOUNTER — Encounter: Payer: Self-pay | Admitting: Obstetrics & Gynecology

## 2012-08-02 ENCOUNTER — Other Ambulatory Visit: Payer: Self-pay | Admitting: *Deleted

## 2012-08-02 VITALS — BP 126/76 | HR 92 | Temp 98.8°F | Ht 59.0 in | Wt 101.0 lb

## 2012-08-02 DIAGNOSIS — N809 Endometriosis, unspecified: Secondary | ICD-10-CM

## 2012-08-02 DIAGNOSIS — N979 Female infertility, unspecified: Secondary | ICD-10-CM

## 2012-08-02 NOTE — Patient Instructions (Addendum)
Infertility WHAT IS INFERTILITY?  Infertility is usually defined as not being able to get pregnant after trying for one year of regular sexual intercourse without the use of contraceptives. Or not being able to carry a pregnancy to term and have a baby. The infertility rate in the [place] is around 10%. Pregnancy is the result of a chain of events. A woman must release an egg from one of her ovaries (ovulation). The egg must be fertilized by the female sperm. Then it travels through a fallopian tube into the uterus (womb), where it attaches to the wall of the uterus and grows. A man must have enough sperm, and the sperm must join with (fertilize) the egg along the way, at the proper time. The fertilized egg must then become attached to the inside of the uterus. While this may seem simple, many things can happen to prevent pregnancy from occurring.  WHOSE PROBLEM IS IT?  About 20% of infertility cases are due to problems with the man (female factors) and 65% are due to problems with the woman (female factors). Other cases are due to a combination of female and female factors or to unknown causes.  WHAT CAUSES INFERTILITY IN MEN?  Infertility in men is often caused by problems with making enough normal sperm or getting the sperm to reach the egg. Problems with sperm may exist from birth or develop later in life, due to illness or injury. Some men produce no sperm, or produce too few sperm (oligospermia). Other problems include:  Sexual dysfunction.  Hormonal or endocrine problems.  Age. Female fertility decreases with age, but not at as young an age as female fertility.  Infection.  Congenital problems. Birth defect, such as absence of the tubes that carry the sperm (vas deferens).  Genetic/chromosomal problems.  Antisperm antibody problems.  Retrograde ejaculation (sperm go into the bladder).  Varicoceles, spematoceles, or tumors of the testicles.  Lifestyle can influence the number and  quality of a man's sperm.  Alcohol and drugs can temporarily reduce sperm quality.  Environmental toxins, including pesticides and lead, may cause some cases of infertility in men. WHAT CAUSES INFERTILITY IN WOMEN?   Problems with ovulation account for most infertility in women. Without ovulation, eggs are not available to be fertilized.  Signs of problems with ovulation include irregular menstrual periods or no periods at all.  Simple lifestyle factors, including stress, diet, or athletic training, can affect a woman's hormonal balance.  Age. Fertility begins to decrease in women in the early 30s and is worse after age 37.  Much less often, a hormonal imbalance from a serious medical problem, such as a pituitary gland tumor, thyroid or other chronic medical disease, can cause ovulation problems.  Pelvic infections.  Polycystic ovary syndrome (increase in female hormones, unable to ovulate).  Alcohol or illegal drugs.  Environmental toxins, radiation, pesticides, and certain chemicals.  Aging is an important factor in female infertility.  The ability of a woman's ovaries to produce eggs declines with age, especially after age 35. About one third of couples where the woman is over 35 will have problems with fertility.  By the time she reaches menopause when her monthly periods stop for good, a woman can no longer produce eggs or become pregnant.  Other problems can also lead to infertility in women. If the fallopian tubes are blocked at one or both ends, the egg cannot travel through the tubes into the uterus. Scar tissue (adhesions) in the pelvis may cause blocked   tubes. This may result from pelvic inflammatory disease, endometriosis, or surgery for an ectopic pregnancy (fertilized egg implanted outside the uterus) or any pelvic or abdominal surgery causing adhesions.  Fibroid tumors or polyps of the uterus.  Congenital (birth defect) abnormalities of the uterus.  Infection of the  cervix (cervicitis).  Cervical stenosis (narrowing).  Abnormal cervical mucus.  Polycystic ovary syndrome.  Having sexual intercourse too often (every other day or 4 to 5 times a week).  Obesity.  Anorexia.  Poor nutrition.  Over exercising, with loss of body fat.  DES. Your mother received diethylstilbesterol hormone when pregnant with you. HOW IS INFERTILITY TESTED?  If you have been trying to have a baby without success, you may want to seek medical help. You should not wait for one year of trying before seeing a health care provider if:  You are over 35.  You have reason to believe that there may be a fertility problem. A medical evaluation may determine the reasons for a couple's infertility. Usually this process begins with:  Physical exams.  Medical histories of both partners.  Sexual histories of both partners. If there is no obvious problem, like improperly timed intercourse or absence of ovulation, tests may be needed.   For a man, testing usually begins with tests of his semen to look at:  The number of sperm.  The shape of sperm.  Movement of his sperm.  Taking a complete medical and surgical history.  Physical examination.  Check for infection of the female reproductive organs. Sometimes hormone tests are done.   For a woman, the first step in testing is to find out if she is ovulating each month. There are several ways to do this. For example, she can keep track of changes in her morning body temperature and in the texture of her cervical mucus. Another tool is a home ovulation test kit, which can be bought at drug or grocery stores.  Checks of ovulation can also be done in the doctor's office, using blood tests for hormone levels or ultrasound tests of the ovaries. If the woman is ovulating, more tests will need to be done. Some common female tests include:  Hysterosalpingogram: An x-ray of the fallopian tubes and uterus after they are injected with  dye. It shows if the tubes are open and shows the shape of the uterus.  Laparoscopy: An exam of the tubes and other female organs for disease. A lighted tube called a laparoscope is used to see inside the abdomen.  Endometrial biopsy: Sample of uterus tissue taken on the first day of the menstrual period, to see if the tissue indicates you are ovulating.  Transvaginal ultrasound: Examines the female organs.  Hysteroscopy: Uses a lighted tube to examine the cervix and inside the uterus, to see if there are any abnormalities inside the uterus. TREATMENT  Depending on the test results, different treatments can be suggested. The type of treatment depends on the cause. 85 to 90% of infertility cases are treated with drugs or surgery.   Various fertility drugs may be used for women with ovulation problems. It is important to talk with your caregiver about the drug to be used. You should understand the drug's benefits and side effects. Depending on the type of fertility drug and the dosage of the drug used, multiple births (twins or multiples) can occur in some women.  If needed, surgery can be done to repair damage to a woman's ovaries, fallopian tubes, cervix, or uterus.  Surgery   or medical treatment for endometriosis or polycystic ovary syndrome. Sometimes a man has an infertility problem that can be corrected with medicine or by surgery.  Intrauterine insemination (IUI) of sperm, timed with ovulation.  Change in lifestyle, if that is the cause (lose weight, increase exercise, and stop smoking, drinking excessively, or taking illegal drugs).  Other types of surgery:  Removing growths inside and on the uterus.  Removing scar tissue from inside of the uterus.  Fixing blocked tubes.  Removing scar tissue in the pelvis and around the female organs. WHAT IS ASSISTED REPRODUCTIVE TECHNOLOGY (ART)?  Assisted reproductive technology (ART) is another form of special methods used to help infertile  couples. ART involves handling both the woman's eggs and the man's sperm. Success rates vary and depend on many factors. ART can be expensive and time-consuming. But ART has made it possible for many couples to have children that otherwise would not have been conceived. Some methods are listed below:  In vitro fertilization (IVF). IVF is often used when a woman's fallopian tubes are blocked or when a man has low sperm counts. A drug is used to stimulate the ovaries to produce multiple eggs. Once mature, the eggs are removed and placed in a culture dish with the man's sperm for fertilization. After about 40 hours, the eggs are examined to see if they have become fertilized by the sperm and are dividing into cells. These fertilized eggs (embryos) are then placed in the woman's uterus. This bypasses the fallopian tubes.  Gamete intrafallopian transfer (GIFT) is similar to IVF, but used when the woman has at least one normal fallopian tube. Three to five eggs are placed in the fallopian tube, along with the man's sperm, for fertilization inside the woman's body.  Zygote intrafallopian transfer (ZIFT), also called tubal embryo transfer, combines IVF and GIFT. The eggs retrieved from the woman's ovaries are fertilized in the lab and placed in the fallopian tubes rather than in the uterus.  ART procedures sometimes involve the use of donor eggs (eggs from another woman) or previously frozen embryos. Donor eggs may be used if a woman has impaired ovaries or has a genetic disease that could be passed on to her baby.  When performing ART, you are at higher risk for resulting in multiple pregnancies, twins, triplets or more.  Intracytoplasma sperm injection is a procedure that injects a single sperm into the egg to fertilize it.  Embryo transplant is a procedure that starts after growing an embryo in a special media (chemical solution) developed to keep the embryo alive for 2 to 5 days, and then transplanting it  into the uterus. In cases where a cause cannot be found and pregnancy does not occur, adoption may be a consideration. Document Released: 02/03/2003 Document Revised: 04/25/2011 Document Reviewed: 12/30/2008 [employer] Patient Information 2014 [employer].  

## 2012-08-02 NOTE — Progress Notes (Signed)
Subjective:     Dawn Richmond is a 22 y.o. female here for a routine exam.  Current complaints: infertility consult. Pt states she has been actively trying for 7 months to become pregnant. Pt states miscarried August 2013 at about 6 weeks. Pt states she has one living child. Pt states she was diagnosis with endometriosis by her family doctor.  Personal health questionnaire reviewed: yes.   Gynecologic History Patient's last menstrual period was 07/12/2012. Contraception: none Last Pap: 2013. Results were: normal   Obstetric History OB History   Grav Para Term Preterm Abortions TAB SAB Ect Mult Living                   The following portions of the patient's history were reviewed and updated as appropriate: allergies, current medications, past family history, past medical history, past social history, past surgical history and problem list.  Review of Systems Pertinent items are noted in HPI.    Objective:   General:  alert     Abdomen: soft, non-tender; bowel sounds normal; no masses,  no organomegaly   Vulva:  normal  Vagina: normal vagina  Cervix:  no lesions  Corpus: normal size, contour, position, consistency, mobility, non-tender  Adnexa:  normal adnexa    Assessment:   Secondary infertility H/O endometriosis Not trying to conceive at this point  Plan:   Basic infertility evaluation, including an HSG Return to review test results

## 2012-08-05 DIAGNOSIS — N809 Endometriosis, unspecified: Secondary | ICD-10-CM | POA: Insufficient documentation

## 2012-08-05 DIAGNOSIS — N979 Female infertility, unspecified: Secondary | ICD-10-CM | POA: Insufficient documentation

## 2012-08-15 ENCOUNTER — Other Ambulatory Visit

## 2012-08-23 ENCOUNTER — Ambulatory Visit (HOSPITAL_COMMUNITY): Admission: RE | Admit: 2012-08-23 | Source: Ambulatory Visit

## 2012-08-24 ENCOUNTER — Ambulatory Visit (HOSPITAL_COMMUNITY)

## 2013-01-05 ENCOUNTER — Emergency Department (HOSPITAL_COMMUNITY)
Admission: EM | Admit: 2013-01-05 | Discharge: 2013-01-05 | Disposition: A | Discharge status: Home / Self Care | Attending: Emergency Medicine | Admitting: Emergency Medicine

## 2013-01-05 ENCOUNTER — Encounter (HOSPITAL_COMMUNITY): Payer: Self-pay | Admitting: Emergency Medicine

## 2013-01-05 DIAGNOSIS — R21 Rash and other nonspecific skin eruption: Secondary | ICD-10-CM | POA: Insufficient documentation

## 2013-01-05 MED ORDER — PREDNISONE 20 MG PO TABS: ORAL_TABLET | ORAL | Status: DC

## 2013-01-05 MED ORDER — HYDROXYZINE HCL 25 MG PO TABS: 25.0000 mg | ORAL_TABLET | Freq: Four times a day (QID) | ORAL | Status: DC

## 2013-01-05 NOTE — ED Notes (Signed)
Pt from home reports that she moved into new house on Wednesday and has had itchy rash since. Pt has not tried OTC meds. Pt is A&O and in NAD

## 2013-01-05 NOTE — ED Provider Notes (Signed)
CSN: [id number]     Arrival date & time 01/05/13  0741 History   First MD Initiated Contact with Patient 01/05/13 0756     Chief Complaint  Patient presents with  . Rash   (Consider location/radiation/quality/duration/timing/severity/associated sxs/prior Treatment) HPI Comments: Patient presents to the ER for evaluation of itchy rash. She reports that she moved into a new house the day before the rash began. She brought all her own furniture and benefits to the new home. When she woke up the next morning she had red bumps on her legs that were very itchy. Rash has persisted ever since. Areas and feet have scabbed over, new areas have arisen. No lesions seen to appear in the morning after awakening. Daughter has slept in the bed with her, but has eczema and has a rash all over already, can't tell if she has similar rash.  Patient is a 22 y.o. female presenting with rash.  Rash Associated symptoms: no fever and no shortness of breath     History reviewed. No pertinent past medical history. Past Surgical History  Procedure Laterality Date  . Induced abortion    . Therapeutic abortion  august 3rd 2013   Family History  Problem Relation Age of Onset  . Diabetes Mother   . Hypertension Mother   . Glaucoma Mother    History  Substance Use Topics  . Smoking status: Never Smoker   . Smokeless tobacco: Not on file  . Alcohol Use: Yes     Comment: occasional   OB History   Grav Para Term Preterm Abortions TAB SAB Ect Mult Living                 Review of Systems  Constitutional: Negative for fever.  HENT: Negative.   Respiratory: Negative for shortness of breath.   Skin: Positive for rash.  All other systems reviewed and are negative.    Allergies  Review of patient's allergies indicates no known allergies.  Home Medications   Current Outpatient Rx  Name  Route  Sig  Dispense  Refill  . metoCLOPramide (REGLAN) 10 MG tablet   Oral   Take 1 tablet (10 mg total) by mouth 3  (three) times daily as needed (nausea).   15 tablet   0   . traMADol (ULTRAM) 50 MG tablet   Oral   Take 1 tablet (50 mg total) by mouth every 6 (six) hours as needed for pain.   15 tablet   0    BP 122/84  Pulse 93  Temp(Src) 98.1 F (36.7 C) (Oral)  Resp 16  SpO2 100%  LMP 12/17/2012 Physical Exam  Constitutional: She is oriented to person, place, and time. She appears well-developed and well-nourished. No distress.  HENT:  Head: Normocephalic and atraumatic.  Right Ear: Hearing normal.  Left Ear: Hearing normal.  Nose: Nose normal.  Mouth/Throat: Oropharynx is clear and moist and mucous membranes are normal.  Eyes: Conjunctivae and EOM are normal. Pupils are equal, round, and reactive to light.  Neck: Normal range of motion. Neck supple.  Cardiovascular: Regular rhythm, S1 normal and S2 normal.  Exam reveals no gallop and no friction rub.   No murmur heard. Pulmonary/Chest: Effort normal and breath sounds normal. No respiratory distress. She exhibits no tenderness.  Abdominal: Soft. Normal appearance and bowel sounds are normal. There is no hepatosplenomegaly. There is no tenderness. There is no rebound, no guarding, no tenderness at McBurney's point and negative Murphy's sign. No hernia.  Musculoskeletal:  Normal range of motion.  Neurological: She is alert and oriented to person, place, and time. She has normal strength. No cranial nerve deficit or sensory deficit. Coordination normal. GCS eye subscore is 4. GCS verbal subscore is 5. GCS motor subscore is 6.  Skin: Skin is warm, dry and intact. No rash noted. No cyanosis.  Several excoriated areas, mostly on lower extremities.  One lesion identified that is erythematous, raised, well circumscribed.  Psychiatric: She has a normal mood and affect. Her speech is normal and behavior is normal. Thought content normal.    ED Course  Procedures (including critical care time) Labs Review Labs Reviewed - No data to  display Imaging Review No results found.  EKG Interpretation   None       MDM  Diagnosis: Rash, likely insect bites  Patient presents with a 2 rash that began after moving into a new environment. Most of the lesions are fading and scabbed over, one identified lesion is red, raised and does look suspicious for insect bite. There is no grouping, but would be suspicious for bed bugs based on the fact that she just moved into a new home. She did change her body soap as well. Patient told to eliminate any new skin products, counseled that if she stays out of the house for one or 2 days and the lesions go away, with no new lesions arising, but it is environmental at the home. Cannot rule out insect bite, including bed bugs. Allergic response also possible. Patient to be treated with Vistaril for itching, prednisone taper. There is nothing to indicate that this is an infectious rash.    Gilda Crease, MD 01/05/13 [phone]

## 2013-04-13 ENCOUNTER — Emergency Department (HOSPITAL_COMMUNITY)
Admission: EM | Admit: 2013-04-13 | Discharge: 2013-04-13 | Disposition: A | Discharge status: Home / Self Care | Attending: Emergency Medicine | Admitting: Emergency Medicine

## 2013-04-13 ENCOUNTER — Encounter (HOSPITAL_COMMUNITY): Payer: Self-pay | Admitting: Emergency Medicine

## 2013-04-13 DIAGNOSIS — L509 Urticaria, unspecified: Secondary | ICD-10-CM | POA: Insufficient documentation

## 2013-04-13 DIAGNOSIS — Z79899 Other long term (current) drug therapy: Secondary | ICD-10-CM | POA: Insufficient documentation

## 2013-04-13 MED ORDER — DEXAMETHASONE SODIUM PHOSPHATE 10 MG/ML IJ SOLN
10.0000 mg | Freq: Once | INTRAMUSCULAR | Status: AC
  Administered 2013-04-13: 10 mg via INTRAMUSCULAR
  Filled 2013-04-13: qty 1

## 2013-04-13 MED ORDER — HYDROXYZINE HCL 25 MG PO TABS: 25.0000 mg | ORAL_TABLET | Freq: Four times a day (QID) | ORAL | Status: DC

## 2013-04-13 NOTE — ED Provider Notes (Signed)
CSN: [id number]     Arrival date & time 04/13/13  1025 History   First MD Initiated Contact with Patient 04/13/13 1046     Chief Complaint  Patient presents with  . Rash     (Consider location/radiation/quality/duration/timing/severity/associated sxs/prior Treatment) HPI  This is a 23 year old female who presents the emergency department chief complaint of rash.  The patient states that she was seen here before with the same complaint.  She has had intermittent severely pruritic itching from head to toe with hive formation since moving into an apartment back in December.  Patient states it began very first night she slept there.  Patient states she is very frustrated and becomes tearful during interview as she has tried desperately to move from the apartment.  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  Patient denies any fevers, chills, nausea, wheezing, throat swelling, lip swelling, drainage from her wounds or other signs of secondary infection.  History reviewed. No pertinent past medical history. Past Surgical History  Procedure Laterality Date  . Induced abortion    . Therapeutic abortion  august 3rd 2013   Family History  Problem Relation Age of Onset  . Diabetes Mother   . Hypertension Mother   . Glaucoma Mother    History  Substance Use Topics  . Smoking status: Never Smoker   . Smokeless tobacco: Not on file  . Alcohol Use: Yes     Comment: occasional   OB History   Grav Para Term Preterm Abortions TAB SAB Ect Mult Living                 Review of Systems  Constitutional: Negative for fever and chills.  HENT: Negative for facial swelling, sore throat and voice change.   Respiratory: Negative for chest tightness, shortness of breath and wheezing.   Skin: Positive for rash.      Allergies  Review of patient's allergies indicates no known allergies.  Home  Medications   Current Outpatient Rx  Name  Route  Sig  Dispense  Refill  . Norgestimate-Ethinyl Estradiol Triphasic (ORTHO TRI-CYCLEN, 28,) 0.18/0.215/0.25 MG-35 MCG tablet   Oral   Take 1 tablet by mouth daily.         . hydrOXYzine (ATARAX/VISTARIL) 25 MG tablet   Oral   Take 1 tablet (25 mg total) by mouth every 6 (six) hours.   12 tablet   0    BP 116/76  Pulse 118  Temp(Src) 98.1 F (36.7 C) (Oral)  Resp 20  SpO2 99%  LMP 02/22/2013 Physical Exam  Constitutional: She is oriented to person, place, and time. She appears well-developed and well-nourished. No distress.  HENT:  Head: Normocephalic and atraumatic.  Eyes: Conjunctivae are normal. No scleral icterus.  Neck: Normal range of motion.  Cardiovascular: Normal rate, regular rhythm and normal heart sounds.  Exam reveals no gallop and no friction rub.   No murmur heard. Pulmonary/Chest: Effort normal and breath sounds normal. No respiratory distress.  Abdominal: Soft. Bowel sounds are normal. She exhibits no distension and no mass. There is no tenderness. There is no guarding.  Musculoskeletal:  Patient generalized hives on the arms, trunk, legs, buttocks, abdomen.  There is no palmar, plantar or mucosal involvement.  Multiple areas of excoriation. No signs of secondary infection  Neurological: She is alert and oriented to person, place, and time.  Skin: Skin is  warm and dry. She is not diaphoretic.    ED Course  Procedures (including critical care time) Labs Review Labs Reviewed - No data to display Imaging Review No results found.   EKG Interpretation None      MDM   Final diagnoses:  Full body hives   Patient with hive formation.  Am giving the patient IM Decadron.  Patient will be discharged with Atarax.  Also given the patient multiple hand held on solution Tobrex termination of this insect as well as information about carpet beetle dermatitis.  Patient will be given referral to dermatology.   Patient did become frustrated that I could not give her a note stating that her hives were reaction to carpet beetle's.  I have asked the patient to follow up with dermatology as that is her specialty.  She can also call [facility] to see to provide that care under her insurance.The patient appears reasonably screened and/or stabilized for discharge and I doubt any other medical condition or other [facility] requiring further screening, evaluation, or treatment in the ED at this time prior to discharge.      Arthor Captainbigail Calvin Chura, PA-C 04/13/13 1113

## 2013-04-13 NOTE — ED Notes (Signed)
Pt from home c/o all over rash that is itching. Pt unable to sleep from itching. Pt states that exterminator at her apartment found "carpet beetles". Pt is A&O and in NAD

## 2013-04-13 NOTE — Discharge Instructions (Signed)
Hydroxyzine capsules or tablets What is this medicine? HYDROXYZINE (hye DROX i zeen) is an antihistamine. This medicine is used to treat allergy symptoms. It is also used to treat anxiety and tension. This medicine can be used with other medicines to induce sleep before surgery. This medicine may be used for other purposes; ask your health care provider or pharmacist if you have questions. COMMON BRAND NAME(S): ANX , Atarax, Rezine, Vistaril What should I tell my health care provider before I take this medicine? They need to know if you have any of these conditions: -any chronic illness -difficulty passing urine -glaucoma -heart disease -kidney disease -liver disease -lung disease -an unusual or allergic reaction to hydroxyzine, cetirizine, other medicines, foods, dyes, or preservatives -pregnant or trying to get pregnant -breast-feeding How should I use this medicine? Take this medicine by mouth with a full glass of water. Follow the directions on the prescription label. You may take this medicine with food or on an empty stomach. Take your medicine at regular intervals. Do not take your medicine more often than directed. Talk to your pediatrician regarding the use of this medicine in children. Special care may be needed. While this drug may be prescribed for children as young as 386 years of age for selected conditions, precautions do apply. Patients over 25 years old may have a stronger reaction and need a smaller dose. Overdosage: If you think you have taken too much of this medicine contact a poison control center or emergency room at once. NOTE: This medicine is only for you. Do not share this medicine with others. What if I miss a dose? If you miss a dose, take it as soon as you can. If it is almost time for your next dose, take only that dose. Do not take double or extra doses. What may interact with this medicine? -alcohol -barbiturate medicines for sleep or seizures -medicines for  colds, allergies -medicines for depression, anxiety, or emotional disturbances -medicines for pain -medicines for sleep -muscle relaxants This list may not describe all possible interactions. Give your health care provider a list of all the medicines, herbs, non-prescription drugs, or dietary supplements you use. Also tell them if you smoke, drink alcohol, or use illegal drugs. Some items may interact with your medicine. What should I watch for while using this medicine? Tell your doctor or health care professional if your symptoms do not improve. You may get drowsy or dizzy. Do not drive, use machinery, or do anything that needs mental alertness until you know how this medicine affects you. Do not stand or sit up quickly, especially if you are an older patient. This reduces the risk of dizzy or fainting spells. Alcohol may interfere with the effect of this medicine. Avoid alcoholic drinks. Your mouth may get dry. Chewing sugarless gum or sucking hard candy, and drinking plenty of water may help. Contact your doctor if the problem does not go away or is severe. This medicine may cause dry eyes and blurred vision. If you wear contact lenses you may feel some discomfort. Lubricating drops may help. See your eye doctor if the problem does not go away or is severe. If you are receiving skin tests for allergies, tell your doctor you are using this medicine. What side effects may I notice from receiving this medicine? Side effects that you should report to your doctor or health care professional as soon as possible: -fast or irregular heartbeat -difficulty passing urine -seizures -slurred speech or confusion -tremor Side effects  that usually do not require medical attention (report to your doctor or health care professional if they continue or are bothersome): -constipation -drowsiness -fatigue -headache -stomach upset This list may not describe all possible side effects. Call your doctor for  medical advice about side effects. You may report side effects to FDA at 1-800-FDA-1088. Where should I keep my medicine? Keep out of the reach of children. Store at room temperature between 15 and 30 degrees C (59 and 86 degrees F). Keep container tightly closed. Throw away any unused medicine after the expiration date. NOTE: This sheet is a summary. It may not cover all possible information. If you have questions about this medicine, talk to your doctor, pharmacist, or health care provider.  2014, Elsevier/Gold Standard. (2007-06-15 14:50:59) Hives Hives are itchy, red, swollen areas of the skin. They can vary in size and location on your body. Hives can come and go for hours or several days (acute hives) or for several weeks (chronic hives). Hives do not spread from person to person (noncontagious). They may get worse with scratching, exercise, and emotional stress. CAUSES   Allergic reaction to food, additives, or drugs.  Infections, including the common cold.  Illness, such as vasculitis, lupus, or thyroid disease.  Exposure to sunlight, heat, or cold.  Exercise.  Stress.  Contact with chemicals. SYMPTOMS   Red or white swollen patches on the skin. The patches may change size, shape, and location quickly and repeatedly.  Itching.  Swelling of the hands, feet, and face. This may occur if hives develop deeper in the skin. DIAGNOSIS  Your caregiver can usually tell what is wrong by performing a physical exam. Skin or blood tests may also be done to determine the cause of your hives. In some cases, the cause cannot be determined. TREATMENT  Mild cases usually get better with medicines such as antihistamines. Severe cases may require an emergency epinephrine injection. If the cause of your hives is known, treatment includes avoiding that trigger.  HOME CARE INSTRUCTIONS   Avoid causes that trigger your hives.  Take antihistamines as directed by your caregiver to reduce the  severity of your hives. Non-sedating or low-sedating antihistamines are usually recommended. Do not drive while taking an antihistamine.  Take any other medicines prescribed for itching as directed by your caregiver.  Wear loose-fitting clothing.  Keep all follow-up appointments as directed by your caregiver. SEEK MEDICAL CARE IF:   You have persistent or severe itching that is not relieved with medicine.  You have painful or swollen joints. SEEK IMMEDIATE MEDICAL CARE IF:   You have a fever.  Your tongue or lips are swollen.  You have trouble breathing or swallowing.  You feel tightness in the throat or chest.  You have abdominal pain. These problems may be the first sign of a life-threatening allergic reaction. Call your local emergency services (911 in [place] MAKE SURE YOU:   Understand these instructions.  Will watch your condition.  Will get help right away if you are not doing well or get worse. Document Released: 01/31/2005 Document Revised: 08/02/2011 Document Reviewed: 04/26/2011 [facility] Patient Information 2014 [place], [place].

## 2013-04-14 NOTE — ED Provider Notes (Signed)
Medical screening examination/treatment/procedure(s) were performed by non-physician practitioner and as supervising physician I was immediately available for consultation/collaboration.   EKG Interpretation None        Oneita Allmon T Rowdy Guerrini, MD 04/14/13 0704 

## 2015-02-03 ENCOUNTER — Ambulatory Visit: Admitting: Skilled Nursing Facility1

## 2018-04-26 DIAGNOSIS — M545 Low back pain, unspecified: Secondary | ICD-10-CM | POA: Insufficient documentation

## 2018-06-26 DIAGNOSIS — G4700 Insomnia, unspecified: Secondary | ICD-10-CM | POA: Insufficient documentation

## 2018-07-24 DIAGNOSIS — F4321 Adjustment disorder with depressed mood: Secondary | ICD-10-CM | POA: Insufficient documentation

## 2018-11-23 DIAGNOSIS — L309 Dermatitis, unspecified: Secondary | ICD-10-CM | POA: Insufficient documentation

## 2020-09-05 DIAGNOSIS — S72321A Displaced transverse fracture of shaft of right femur, initial encounter for closed fracture: Secondary | ICD-10-CM | POA: Insufficient documentation

## 2021-03-10 LAB — CYTOLOGY - PAP: Pap: NEGATIVE

## 2021-03-12 LAB — OB RESULTS CONSOLE HIV ANTIBODY (ROUTINE TESTING): HIV: NONREACTIVE

## 2021-03-12 LAB — HEPATITIS C ANTIBODY: HCV Ab: NEGATIVE

## 2021-03-12 LAB — OB RESULTS CONSOLE VARICELLA ZOSTER ANTIBODY, IGG: Varicella: IMMUNE

## 2021-03-12 LAB — OB RESULTS CONSOLE RUBELLA ANTIBODY, IGM: Rubella: IMMUNE

## 2021-03-12 LAB — OB RESULTS CONSOLE PLATELET COUNT: Platelets: 422

## 2021-03-12 LAB — OB RESULTS CONSOLE RPR: RPR: NONREACTIVE

## 2021-03-12 LAB — OB RESULTS CONSOLE HEPATITIS B SURFACE ANTIGEN: Hepatitis B Surface Ag: NEGATIVE

## 2021-03-12 LAB — OB RESULTS CONSOLE GC/CHLAMYDIA
Chlamydia: NEGATIVE
Gonorrhea: NEGATIVE

## 2021-03-12 LAB — OB RESULTS CONSOLE ANTIBODY SCREEN: Antibody Screen: NEGATIVE

## 2021-03-12 LAB — OB RESULTS CONSOLE HGB/HCT, BLOOD
HCT: 39 (ref 29–41)
Hemoglobin: 12.6

## 2021-05-08 ENCOUNTER — Other Ambulatory Visit: Payer: Self-pay

## 2021-05-31 ENCOUNTER — Encounter: Payer: Self-pay | Admitting: *Deleted

## 2021-06-18 ENCOUNTER — Encounter: Payer: Self-pay | Admitting: Obstetrics & Gynecology

## 2021-06-18 ENCOUNTER — Ambulatory Visit (INDEPENDENT_AMBULATORY_CARE_PROVIDER_SITE_OTHER): Payer: Non-veteran care | Admitting: Obstetrics & Gynecology

## 2021-06-18 VITALS — BP 107/65 | HR 74 | Wt 129.0 lb

## 2021-06-18 DIAGNOSIS — O34219 Maternal care for unspecified type scar from previous cesarean delivery: Secondary | ICD-10-CM

## 2021-06-18 DIAGNOSIS — Z3480 Encounter for supervision of other normal pregnancy, unspecified trimester: Secondary | ICD-10-CM | POA: Insufficient documentation

## 2021-06-18 NOTE — Progress Notes (Signed)
Patient detail anatomy scheduled for Friday, May 26 @ 730am. [name] was given the appointment information and she verbalized understanding. No questions or concerns. ? ?Dawn Richmond, CMA  ? ?06/18/21  ?

## 2021-06-18 NOTE — Progress Notes (Signed)
?  Subjective:transfer from [facility] and Dr. Garwin Brothers  ? ? Dawn Richmond is a [id number] [redacted]w[redacted]d being seen today for her first obstetrical visit.  Her obstetrical history is significant for  previous cesarean section . Patient does intend to breast feed. Pregnancy history fully reviewed. ? ?Patient reports occasional contractions. ? ?Vitals:  ? 06/18/21 0921  ?BP: 107/65  ?Pulse: 74  ?Weight: 129 lb (58.5 kg)  ? ? ?HISTORY: ?OB History  ?Gravida Para Term Preterm AB Living  ?4 1 1   2 1   ?SAB IAB Ectopic Multiple Live Births  ?1 1     1   ?  ?# Outcome Date GA Lbr Len/2nd Weight Sex Delivery Anes PTL Lv  ?4 Current           ?3 Term 2010    F CS-Unspec   LIV  ?2 IAB           ?1 SAB           ? ?History reviewed. No pertinent past medical history. ?Past Surgical History:  ?Procedure Laterality Date  ? INDUCED ABORTION    ? THERAPEUTIC ABORTION  august 3rd 2013  ? ?Family History  ?Problem Relation Age of Onset  ? Diabetes Mother   ? Hypertension Mother   ? Glaucoma Mother   ? ? ? ?Exam  ? ? ?Uterus:   23 weeks  ?Pelvic Exam:   ? Perineum:   ? Vulva:   ? Vagina:    ? pH:   ? Cervix:   ? Adnexa:   ? Bony Pelvis:   ?System: Breast:    ? Skin: normal coloration and turgor, no rashes ?  ? Neurologic: normal, normal mood  ? Extremities: normal strength, tone, and muscle mass  ? HEENT PERRLA  ? Mouth/Teeth dental hygiene good  ? Neck supple  ? Cardiovascular: regular rate and rhythm  ? Respiratory:  appears well, vitals normal, no respiratory distress, acyanotic, normal RR  ? Abdomen: gravid  ? Urinary:   ? ? ?  ?Assessment:  ? ? Pregnancy: [id number] ?Patient Active Problem List  ? Diagnosis Date Noted  ? Supervision of other normal pregnancy, antepartum 06/18/2021  ? Endometriosis 08/05/2012  ? 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 ?  ? ?  ?Plan:  ? ?  ?Initial labs drawn. ?Prenatal vitamins. ?Problem list reviewed and updated. ?Genetic Screening discussed : results reviewed. ? Ultrasound discussed; fetal survey: ordered. ? Follow up  in 4 weeks. ?50% of 30 min visit spent on counseling and coordination of care.  ?Patient may continue her present duties ?Plans TOLAC  ? ?[name] ?06/18/2021 ? ? ?

## 2021-07-02 ENCOUNTER — Encounter: Payer: Self-pay | Admitting: Obstetrics & Gynecology

## 2021-07-09 ENCOUNTER — Other Ambulatory Visit: Payer: Self-pay | Admitting: *Deleted

## 2021-07-09 ENCOUNTER — Ambulatory Visit: Payer: 59 | Admitting: *Deleted

## 2021-07-09 ENCOUNTER — Ambulatory Visit: Payer: 59 | Attending: Obstetrics & Gynecology

## 2021-07-09 VITALS — BP 108/63 | HR 91

## 2021-07-09 DIAGNOSIS — Z3480 Encounter for supervision of other normal pregnancy, unspecified trimester: Secondary | ICD-10-CM | POA: Diagnosis not present

## 2021-07-09 DIAGNOSIS — Z3A26 26 weeks gestation of pregnancy: Secondary | ICD-10-CM | POA: Diagnosis not present

## 2021-07-09 DIAGNOSIS — O4403 Placenta previa specified as without hemorrhage, third trimester: Secondary | ICD-10-CM

## 2021-07-09 DIAGNOSIS — O321XX0 Maternal care for breech presentation, not applicable or unspecified: Secondary | ICD-10-CM | POA: Diagnosis not present

## 2021-07-09 DIAGNOSIS — O4402 Placenta previa specified as without hemorrhage, second trimester: Secondary | ICD-10-CM | POA: Insufficient documentation

## 2021-07-09 DIAGNOSIS — Z363 Encounter for antenatal screening for malformations: Secondary | ICD-10-CM | POA: Diagnosis not present

## 2021-07-09 DIAGNOSIS — O283 Abnormal ultrasonic finding on antenatal screening of mother: Secondary | ICD-10-CM

## 2021-07-19 ENCOUNTER — Other Ambulatory Visit: Payer: 59

## 2021-07-19 ENCOUNTER — Ambulatory Visit (INDEPENDENT_AMBULATORY_CARE_PROVIDER_SITE_OTHER): Payer: Non-veteran care | Admitting: Obstetrics & Gynecology

## 2021-07-19 VITALS — BP 110/72 | HR 83 | Wt 132.0 lb

## 2021-07-19 DIAGNOSIS — O4403 Placenta previa specified as without hemorrhage, third trimester: Secondary | ICD-10-CM

## 2021-07-19 DIAGNOSIS — Z3480 Encounter for supervision of other normal pregnancy, unspecified trimester: Secondary | ICD-10-CM

## 2021-07-19 DIAGNOSIS — Z23 Encounter for immunization: Secondary | ICD-10-CM

## 2021-07-19 NOTE — Progress Notes (Deleted)
   PRENATAL VISIT NOTE  Subjective:  Dawn Richmond is a 31 y.o. G4P1021 at [redacted]w[redacted]d being seen today for ongoing prenatal care.  She is currently monitored for the following issues for this low-risk pregnancy and has Endometriosis and Supervision of other normal pregnancy, antepartum on their problem list. Hx of cesarean section and would like TOLAC, pt intends to breastfeed.   Patient reports no complaints. Endorses vigorous fetal movement. Contractions: Not present. Vag. Bleeding: None.  Movement: Absent. Denies leaking of fluid.   The following portions of the patient's history were reviewed and updated as appropriate: allergies, current medications, past family history, past medical history, past social history, past surgical history and problem list.   Objective:   Vitals:   07/19/21 0842  BP: 110/72  Pulse: 83  Weight: 132 lb (59.9 kg)    Fetal Status: Fetal Heart Rate (bpm): 146 Fundal Height: 28 cm Movement: Absent     General:  Alert, oriented and cooperative. Patient is in no acute distress.  Skin: Skin is warm and dry. No rash noted.   Cardiovascular: Normal heart rate noted  Respiratory: Normal respiratory effort, no problems with respiration noted  Abdomen: Soft, gravid, appropriate for gestational age.  Pain/Pressure: Present     Pelvic: Cervical exam performed in the presence of a chaperone        Extremities: Normal range of motion.     Mental Status: Normal mood and affect. Normal behavior. Normal judgment and thought content.   Assessment and Plan:  Pregnancy: G4P1021 at [redacted]w[redacted]d 1. Supervision of other normal pregnancy, antepartum 28 week labs today - Tdap vaccine greater than or equal to 7yo IM  Preterm labor symptoms and general obstetric precautions including but not limited to vaginal bleeding, contractions, leaking of fluid and fetal movement were reviewed in detail with the patient. Please refer to After Visit Summary for other counseling recommendations.     Future Appointments  Date Time Provider Department Center  08/06/2021 10:30 AM [facility] NURSE [facility]  08/06/2021 10:45 AM [facility]  08/13/2021  8:55 AM Marny Lowenstein, PA-C [facility]    [place], Student-PA 07/19/21 9:05 AM

## 2021-07-19 NOTE — Progress Notes (Signed)
   PRENATAL VISIT NOTE  Subjective:  Dawn Richmond is a 31 y.o. G4P1021 at [redacted]w[redacted]d being seen today for ongoing prenatal care.  She is currently monitored for the following issues for this high-risk pregnancy and has Endometriosis and Supervision of other normal pregnancy, antepartum on their problem list.  Patient reports no complaints.  Contractions: Not present. Vag. Bleeding: None.  Movement: Absent. Denies leaking of fluid.   The following portions of the patient's history were reviewed and updated as appropriate: allergies, current medications, past family history, past medical history, past social history, past surgical history and problem list.   Objective:   Vitals:   07/19/21 0842  BP: 110/72  Pulse: 83  Weight: 132 lb (59.9 kg)    Fetal Status: Fetal Heart Rate (bpm): 146   Movement: Absent     General:  Alert, oriented and cooperative. Patient is in no acute distress.  Skin: Skin is warm and dry. No rash noted.   Cardiovascular: Normal heart rate noted  Respiratory: Normal respiratory effort, no problems with respiration noted  Abdomen: Soft, gravid, appropriate for gestational age.  Pain/Pressure: Present     Pelvic: Cervical exam deferred        Extremities: Normal range of motion.     Mental Status: Normal mood and affect. Normal behavior. Normal judgment and thought content.   Assessment and Plan:  Pregnancy: G4P1021 at [redacted]w[redacted]d 1. Supervision of other normal pregnancy, antepartum Placenta previa - Tdap vaccine greater than or equal to 7yo IM  Preterm labor symptoms and general obstetric precautions including but not limited to vaginal bleeding, contractions, leaking of fluid and fetal movement were reviewed in detail with the patient. Please refer to After Visit Summary for other counseling recommendations.   No follow-ups on file.  Future Appointments  Date Time Provider [place]  08/06/2021 10:30 AM [facility] NURSE [facility]  08/06/2021 10:45 AM [facility]  US4 [facility]  08/13/2021  8:55 AM Hillard Danker, Myles Rosenthal, PA-C [facility]    [name], MD

## 2021-07-19 NOTE — Progress Notes (Signed)
   PRENATAL VISIT NOTE  Subjective:  Dawn Richmond is a 31 y.o. G4P1021 at [redacted]w[redacted]d being seen today for ongoing prenatal care.  She is currently monitored for the following issues for this low-risk pregnancy and has Endometriosis and Supervision of other normal pregnancy, antepartum on their problem list. Hx of cesarean section and would like TOLAC, pt intends to breastfeed.   Patient reports no complaints. Endorses vigorous fetal movement. Contractions: Not present. Vag. Bleeding: None.  Movement: Absent. Denies leaking of fluid.   The following portions of the patient's history were reviewed and updated as appropriate: allergies, current medications, past family history, past medical history, past social history, past surgical history and problem list.   Objective:   Vitals:   07/19/21 0842  BP: 110/72  Pulse: 83  Weight: 132 lb (59.9 kg)    Fetal Status: Fetal Heart Rate (bpm): 146 Fundal Height: 28 cm Movement: Absent     General:  Alert, oriented and cooperative. Patient is in no acute distress.  Skin: Skin is warm and dry. No rash noted.   Cardiovascular: Normal heart rate noted  Respiratory: Normal respiratory effort, no problems with respiration noted  Abdomen: Soft, gravid, appropriate for gestational age.  Pain/Pressure: Present     Pelvic: Cervical exam performed in the presence of a chaperone        Extremities: Normal range of motion.     Mental Status: Normal mood and affect. Normal behavior. Normal judgment and thought content.   Assessment and Plan:  Pregnancy: G4P1021 at [redacted]w[redacted]d 1. Supervision of other normal pregnancy, antepartum 28 week labs today - Tdap vaccine greater than or equal to 7yo IM  Preterm labor symptoms and general obstetric precautions including but not limited to vaginal bleeding, contractions, leaking of fluid and fetal movement were reviewed in detail with the patient. Please refer to After Visit Summary for other counseling recommendations.     Future Appointments  Date Time Provider Department Center  08/06/2021 10:30 AM [facility] NURSE [facility]  08/06/2021 10:45 AM [facility]  08/13/2021  8:55 AM Magnus Sinning Dimas [place], PA-C [facility]    [place], Student-PA 07/19/21 1:46 PM

## 2021-07-19 NOTE — Progress Notes (Signed)
Patient reports "period like" cramps that she rates "7 or 8 out of 10" She denies any bleeding and stated that baby has been moving well.

## 2021-07-20 LAB — HIV ANTIBODY (ROUTINE TESTING W REFLEX): HIV Screen 4th Generation wRfx: NONREACTIVE

## 2021-07-20 LAB — CBC
Hematocrit: 34.5 % (ref 34.0–46.6)
Hemoglobin: 11.4 g/dL (ref 11.1–15.9)
MCH: 28 pg (ref 26.6–33.0)
MCHC: 33 g/dL (ref 31.5–35.7)
MCV: 85 fL (ref 79–97)
Platelets: 293 10*3/uL (ref 150–450)
RBC: 4.07 x10E6/uL (ref 3.77–5.28)
RDW: 13.3 % (ref 11.7–15.4)
WBC: 8.8 10*3/uL (ref 3.4–10.8)

## 2021-07-20 LAB — RPR: RPR Ser Ql: NONREACTIVE

## 2021-07-20 LAB — GLUCOSE TOLERANCE, 2 HOURS W/ 1HR
Glucose, 1 hour: 117 mg/dL (ref 70–179)
Glucose, 2 hour: 99 mg/dL (ref 70–152)
Glucose, Fasting: 75 mg/dL (ref 70–91)

## 2021-08-06 ENCOUNTER — Encounter: Payer: Self-pay | Admitting: *Deleted

## 2021-08-06 ENCOUNTER — Ambulatory Visit: Attending: Obstetrics and Gynecology

## 2021-08-06 ENCOUNTER — Ambulatory Visit: Admitting: *Deleted

## 2021-08-06 VITALS — BP 107/64 | HR 79

## 2021-08-06 DIAGNOSIS — O283 Abnormal ultrasonic finding on antenatal screening of mother: Secondary | ICD-10-CM

## 2021-08-06 DIAGNOSIS — Z3A30 30 weeks gestation of pregnancy: Secondary | ICD-10-CM | POA: Diagnosis not present

## 2021-08-06 DIAGNOSIS — O4403 Placenta previa specified as without hemorrhage, third trimester: Secondary | ICD-10-CM | POA: Insufficient documentation

## 2021-08-10 ENCOUNTER — Other Ambulatory Visit: Payer: Self-pay | Admitting: *Deleted

## 2021-08-10 DIAGNOSIS — O4403 Placenta previa specified as without hemorrhage, third trimester: Secondary | ICD-10-CM

## 2021-08-13 ENCOUNTER — Other Ambulatory Visit: Payer: Self-pay

## 2021-08-13 ENCOUNTER — Encounter: Payer: Self-pay | Admitting: Medical

## 2021-08-13 ENCOUNTER — Ambulatory Visit (INDEPENDENT_AMBULATORY_CARE_PROVIDER_SITE_OTHER): Payer: Non-veteran care | Admitting: Medical

## 2021-08-13 VITALS — BP 112/65 | HR 86 | Wt 134.5 lb

## 2021-08-13 DIAGNOSIS — Z1331 Encounter for screening for depression: Secondary | ICD-10-CM

## 2021-08-13 DIAGNOSIS — Z3A31 31 weeks gestation of pregnancy: Secondary | ICD-10-CM

## 2021-08-13 DIAGNOSIS — Z98891 History of uterine scar from previous surgery: Secondary | ICD-10-CM | POA: Insufficient documentation

## 2021-08-13 DIAGNOSIS — Z3480 Encounter for supervision of other normal pregnancy, unspecified trimester: Secondary | ICD-10-CM

## 2021-08-13 DIAGNOSIS — O4403 Placenta previa specified as without hemorrhage, third trimester: Secondary | ICD-10-CM | POA: Insufficient documentation

## 2021-08-13 NOTE — Progress Notes (Signed)
PHQ-9: 9 GAD 7 : 10

## 2021-08-13 NOTE — Progress Notes (Signed)
   PRENATAL VISIT NOTE  Subjective:  Dawn Richmond is a 31 y.o. G4P1021 at [redacted]w[redacted]d being seen today for ongoing prenatal care.  She is currently monitored for the following issues for this high-risk pregnancy and has Endometriosis; Supervision of other normal pregnancy, antepartum; and History of cesarean section on their problem list.  Patient reports no complaints.  Contractions: Not present. Vag. Bleeding: None.  Movement: Present. Denies leaking of fluid.   The following portions of the patient's history were reviewed and updated as appropriate: allergies, current medications, past family history, past medical history, past social history, past surgical history and problem list.   Objective:   Vitals:   08/13/21 0900  BP: 112/65  Pulse: 86  Weight: 134 lb 8 oz (61 kg)    Fetal Status: Fetal Heart Rate (bpm): 154 Fundal Height: 31 cm Movement: Present     General:  Alert, oriented and cooperative. Patient is in no acute distress.  Skin: Skin is warm and dry. No rash noted.   Cardiovascular: Normal heart rate noted  Respiratory: Normal respiratory effort, no problems with respiration noted  Abdomen: Soft, gravid, appropriate for gestational age.  Pain/Pressure: Present     Pelvic: Cervical exam deferred        Extremities: Normal range of motion.  Edema: None  Mental Status: Normal mood and affect. Normal behavior. Normal judgment and thought content.   Assessment and Plan:  Pregnancy: G4P1021 at [redacted]w[redacted]d 1. Supervision of other normal pregnancy, antepartum - Ambulatory referral to [facility] - Planning to use [facility] - Normal third trimester labs reviewed  2. History of cesarean section - Desires TOLAC, however last US showed placenta previa, will wait until next [place] on 7/20 to discuss mode of delivery again   3. [redacted] weeks gestation of pregnancy  4. Positive depression screening - Ambulatory referral to [facility]  5. Placenta previa in  third trimester  - repeat US 7/20  Preterm labor symptoms and general obstetric precautions including but not limited to vaginal bleeding, contractions, leaking of fluid and fetal movement were reviewed in detail with the patient. Please refer to After Visit Summary for other counseling recommendations.   Return in about 2 weeks (around 08/27/2021) for LOB, In-Person, any provider.  Future Appointments  Date Time Provider Department Center  08/30/2021  2:15 PM Adam Phenix, MD [facility]  09/02/2021  8:15 AM [facility] NURSE [facility]  09/02/2021  8:30 AM [facility] US2 [facility]    Vonzella Nipple, PA-C

## 2021-08-16 ENCOUNTER — Telehealth: Payer: Self-pay | Admitting: General Practice

## 2021-08-16 NOTE — Telephone Encounter (Signed)
Patient called into office asking about referral to [facility]. Discussed with patient depression screening she filled out was a little elevated so Raynelle Fanning offered a [facility] referral for her. Patient verbalized understanding and states she already has someone that she sees for that. Told patient she doesn't need to see [place] then. Patient verbalized understanding.

## 2021-08-30 ENCOUNTER — Ambulatory Visit (INDEPENDENT_AMBULATORY_CARE_PROVIDER_SITE_OTHER): Payer: Non-veteran care | Admitting: Obstetrics & Gynecology

## 2021-08-30 ENCOUNTER — Other Ambulatory Visit: Payer: Self-pay

## 2021-08-30 VITALS — BP 109/67 | HR 92 | Wt 134.0 lb

## 2021-08-30 DIAGNOSIS — O4403 Placenta previa specified as without hemorrhage, third trimester: Secondary | ICD-10-CM

## 2021-08-30 DIAGNOSIS — Z3480 Encounter for supervision of other normal pregnancy, unspecified trimester: Secondary | ICD-10-CM

## 2021-08-30 DIAGNOSIS — Z98891 History of uterine scar from previous surgery: Secondary | ICD-10-CM

## 2021-08-30 NOTE — Progress Notes (Signed)
Patient has upcoming ultrasound this Thursday that will determine if she can have a VBAC.  Gigi reports occasional braxton hicks but denies any pain or vaginal bleeding

## 2021-08-30 NOTE — Progress Notes (Signed)
   PRENATAL VISIT NOTE  Subjective:  Dawn Richmond is a 31 y.o. G4P1021 at [redacted]w[redacted]d being seen today for ongoing prenatal care.  She is currently monitored for the following issues for this high-risk pregnancy and has Endometriosis; Supervision of other normal pregnancy, antepartum; History of cesarean section; and Placenta previa in third trimester on their problem list.  Patient reports no complaints.  Contractions: Irritability. Vag. Bleeding: None.  Movement: Present. Denies leaking of fluid.   The following portions of the patient's history were reviewed and updated as appropriate: allergies, current medications, past family history, past medical history, past social history, past surgical history and problem list.   Objective:   Vitals:   08/30/21 1428  BP: 109/67  Pulse: 92  Weight: 134 lb (60.8 kg)    Fetal Status: Fetal Heart Rate (bpm): 169   Movement: Present     General:  Alert, oriented and cooperative. Patient is in no acute distress.  Skin: Skin is warm and dry. No rash noted.   Cardiovascular: Normal heart rate noted  Respiratory: Normal respiratory effort, no problems with respiration noted  Abdomen: Soft, gravid, appropriate for gestational age.  Pain/Pressure: Absent     Pelvic: Cervical exam deferred        Extremities: Normal range of motion.     Mental Status: Normal mood and affect. Normal behavior. Normal judgment and thought content.   Assessment and Plan:  Pregnancy: G4P1021 at [redacted]w[redacted]d 1. Supervision of other normal pregnancy, antepartum   2. History of cesarean section TOLAC if possible  3. Placenta previa in third trimester F/u US in 3 days  Preterm labor symptoms and general obstetric precautions including but not limited to vaginal bleeding, contractions, leaking of fluid and fetal movement were reviewed in detail with the patient. Please refer to After Visit Summary for other counseling recommendations.   Return in about 2 weeks (around  09/13/2021).  Future Appointments  Date Time Provider Department Center  09/02/2021  8:15 AM [facility] NURSE [facility]  09/02/2021  8:30 AM [facility] US2 [facility]    Scheryl Darter, MD

## 2021-09-02 ENCOUNTER — Ambulatory Visit: Payer: 59 | Admitting: *Deleted

## 2021-09-02 ENCOUNTER — Ambulatory Visit: Payer: 59 | Attending: Obstetrics and Gynecology

## 2021-09-02 ENCOUNTER — Other Ambulatory Visit: Payer: 59

## 2021-09-02 VITALS — BP 105/61 | HR 77

## 2021-09-02 DIAGNOSIS — Z3689 Encounter for other specified antenatal screening: Secondary | ICD-10-CM | POA: Diagnosis present

## 2021-09-02 DIAGNOSIS — Z3A34 34 weeks gestation of pregnancy: Secondary | ICD-10-CM | POA: Diagnosis not present

## 2021-09-02 DIAGNOSIS — O4403 Placenta previa specified as without hemorrhage, third trimester: Secondary | ICD-10-CM | POA: Insufficient documentation

## 2021-09-02 DIAGNOSIS — Z363 Encounter for antenatal screening for malformations: Secondary | ICD-10-CM | POA: Diagnosis not present

## 2021-09-14 ENCOUNTER — Ambulatory Visit (INDEPENDENT_AMBULATORY_CARE_PROVIDER_SITE_OTHER): Payer: Non-veteran care | Admitting: Advanced Practice Midwife

## 2021-09-14 ENCOUNTER — Other Ambulatory Visit: Payer: Self-pay

## 2021-09-14 ENCOUNTER — Other Ambulatory Visit (HOSPITAL_COMMUNITY)
Admission: RE | Admit: 2021-09-14 | Discharge: 2021-09-14 | Disposition: A | Discharge status: Home / Self Care | Payer: 59 | Source: Ambulatory Visit | Attending: Advanced Practice Midwife | Admitting: Advanced Practice Midwife

## 2021-09-14 VITALS — BP 121/71 | HR 87 | Wt 137.0 lb

## 2021-09-14 DIAGNOSIS — Z3480 Encounter for supervision of other normal pregnancy, unspecified trimester: Secondary | ICD-10-CM | POA: Diagnosis not present

## 2021-09-14 DIAGNOSIS — O4403 Placenta previa specified as without hemorrhage, third trimester: Secondary | ICD-10-CM

## 2021-09-14 DIAGNOSIS — Z3A36 36 weeks gestation of pregnancy: Secondary | ICD-10-CM

## 2021-09-14 DIAGNOSIS — Z98891 History of uterine scar from previous surgery: Secondary | ICD-10-CM

## 2021-09-14 NOTE — Progress Notes (Signed)
   PRENATAL VISIT NOTE  Subjective:  Dawn Richmond is a 31 y.o. G4P1021 at [redacted]w[redacted]d being seen today for ongoing prenatal care.  She is currently monitored for the following issues for this low-risk pregnancy and has Endometriosis; Supervision of other normal pregnancy, antepartum; History of cesarean section; and Placenta previa in third trimester on their problem list.  Patient reports occasional contractions.  Contractions: Irritability. Vag. Bleeding: None.  Movement: Present. Denies leaking of fluid.   The following portions of the patient's history were reviewed and updated as appropriate: allergies, current medications, past family history, past medical history, past social history, past surgical history and problem list.   Objective:   Vitals:   09/14/21 1517  BP: 121/71  Pulse: 87  Weight: 137 lb (62.1 kg)    Fetal Status: Fetal Heart Rate (bpm): 148 Fundal Height: 34 cm Movement: Present  Presentation: Vertex  General:  Alert, oriented and cooperative. Patient is in no acute distress.  Skin: Skin is warm and dry. No rash noted.   Cardiovascular: Normal heart rate noted  Respiratory: Normal respiratory effort, no problems with respiration noted  Abdomen: Soft, gravid, appropriate for gestational age.  Pain/Pressure: Absent     Pelvic: Cervical exam performed in the presence of a chaperone Dilation: Fingertip Effacement (%): 0 Station: Ballotable  Extremities: Normal range of motion.  Edema: None  Mental Status: Normal mood and affect. Normal behavior. Normal judgment and thought content.   Assessment and Plan:  Pregnancy: G4P1021 at [redacted]w[redacted]d 1. Supervision of other normal pregnancy, antepartum - Routine Care - Culture, beta strep (group b only) - Cervicovaginal ancillary only( [place])  2. Placenta previa in third trimester--resolved   3. History of cesarean section - Briefly discussed R/B/I repeat C/S vs TOLAC. Pt desires TOLAC. It is documented that pt signed consent  08/13/21, but it is not under Media tab. Re-sign consent at [place].   4. [redacted] weeks gestation of pregnancy   Term labor symptoms and general obstetric precautions including but not limited to vaginal bleeding, contractions, leaking of fluid and fetal movement were reviewed in detail with the patient. Please refer to After Visit Summary for other counseling recommendations.   Return in about 1 week (around 09/21/2021) for ROB.  Future Appointments  Date Time Provider Department Center  09/22/2021  3:35 PM Warden Fillers, MD [facility]  09/30/2021  9:15 AM Hermina Staggers, MD [facility]  10/07/2021  1:35 PM Marylene Land, CNM [facility]  10/14/2021  1:15 PM [facility] NST [facility]  10/14/2021  3:35 PM Reva Bores, MD [facility]    Dorathy Kinsman, CNM

## 2021-09-15 LAB — CERVICOVAGINAL ANCILLARY ONLY
Chlamydia: NEGATIVE
Comment: NEGATIVE
Comment: NORMAL
Neisseria Gonorrhea: NEGATIVE

## 2021-09-17 LAB — CULTURE, BETA STREP (GROUP B ONLY): Strep Gp B Culture: POSITIVE — AB

## 2021-09-20 ENCOUNTER — Encounter: Payer: Self-pay | Admitting: *Deleted

## 2021-09-20 DIAGNOSIS — B951 Streptococcus, group B, as the cause of diseases classified elsewhere: Secondary | ICD-10-CM | POA: Insufficient documentation

## 2021-09-22 ENCOUNTER — Other Ambulatory Visit: Payer: Self-pay

## 2021-09-22 ENCOUNTER — Ambulatory Visit (INDEPENDENT_AMBULATORY_CARE_PROVIDER_SITE_OTHER): Payer: Non-veteran care | Admitting: Obstetrics and Gynecology

## 2021-09-22 VITALS — BP 135/75 | HR 97 | Wt 139.0 lb

## 2021-09-22 DIAGNOSIS — O98819 Other maternal infectious and parasitic diseases complicating pregnancy, unspecified trimester: Secondary | ICD-10-CM

## 2021-09-22 DIAGNOSIS — Z98891 History of uterine scar from previous surgery: Secondary | ICD-10-CM

## 2021-09-22 DIAGNOSIS — B951 Streptococcus, group B, as the cause of diseases classified elsewhere: Secondary | ICD-10-CM

## 2021-09-22 DIAGNOSIS — Z3A37 37 weeks gestation of pregnancy: Secondary | ICD-10-CM

## 2021-09-22 DIAGNOSIS — Z3480 Encounter for supervision of other normal pregnancy, unspecified trimester: Secondary | ICD-10-CM

## 2021-09-22 NOTE — Progress Notes (Signed)
PRENATAL VISIT NOTE  Subjective:  Dawn Richmond is a 31 y.o. G4P1021 at [redacted]w[redacted]d being seen today for ongoing prenatal care.  She is currently monitored for the following issues for this high-risk pregnancy and has Endometriosis; Supervision of other normal pregnancy, antepartum; History of cesarean section; Placenta previa in third trimester; and Group B streptococcal infection in pregnancy on their problem list.  Patient doing well with no acute concerns today. She reports no complaints.  Contractions: Irritability. Vag. Bleeding: None.  Movement: Present. Denies leaking of fluid.   Faculty Practice OB/GYN Attending Consult Note  31 y.o. [phone] at [redacted]w[redacted]d with Estimated Date of Delivery: 10/12/21 was seen today in office to discuss trial of labor after cesarean section (TOLAC) versus elective repeat cesarean delivery (ERCD). The following risks were discussed with the patient.  Risk of uterine rupture at term is 0.78 percent with TOLAC and 0.22 percent with ERCD. 1 in 10 uterine ruptures will result in neonatal death or neurological injury. The benefits of a trial of labor after cesarean (TOLAC) resulting in a vaginal birth after cesarean (VBAC) include the following: shorter length of hospital stay and postpartum recovery (in most cases); fewer complications, such as postpartum fever, wound or uterine infection, thromboembolism (blood clots in the leg or lung), need for blood transfusion and fewer neonatal breathing problems. The risks of an attempted VBAC or TOLAC include the following: Risk of failed trial of labor after cesarean (TOLAC) without a vaginal birth after cesarean (VBAC) resulting in repeat cesarean delivery (RCD) in about 20 to 40 percent of women who attempt VBAC.  Risk of rupture of uterus resulting in an emergency cesarean delivery. The risk of uterine rupture may be related in part to the type of uterine incision made during the first cesarean delivery. A previous transverse  uterine incision has the lowest risk of rupture (0.2 to 1.5 percent risk). Vertical or T-shaped uterine incisions have a higher risk of uterine rupture (4 to 9 percent risk)The risk of fetal death is very low with both VBAC and elective repeat cesarean delivery (ERCD), but the likelihood of fetal death is higher with VBAC than with ERCD. Maternal death is very rare with either type of delivery. The risks of an elective repeat cesarean delivery (ERCD) were reviewed with the patient including but not limited to: 03/998 risk of uterine rupture which could have serious consequences, bleeding which may require transfusion; infection which may require antibiotics; injury to bowel, bladder or other surrounding organs (bowel, bladder, ureters); injury to the fetus; need for additional procedures including hysterectomy in the event of a life-threatening hemorrhage; thromboembolic phenomenon; abnormal placentation; incisional problems; death and other postoperative or anesthesia complications.    These risks and benefits are summarized on the consent form, which was reviewed with the patient during the visit.  All her questions answered and she signed a consent indicating a preference for TOLAC/ERCD. A copy of the consent was given to the patient.  Pt desires TOLAC and would like to avoid pitocin if possible.  She will accept augmentation or IOL if needed with pitocin    The following portions of the patient's history were reviewed and updated as appropriate: allergies, current medications, past family history, past medical history, past social history, past surgical history and problem list. Problem list updated.  Objective:   Vitals:   09/22/21 1618  BP: 135/75  Pulse: 97  Weight: 139 lb (63 kg)    Fetal Status: Fetal Heart Rate (bpm): 163 Fundal Height: 37  cm Movement: Present     General:  Alert, oriented and cooperative. Patient is in no acute distress.  Skin: Skin is warm and dry. No rash noted.    Cardiovascular: Normal heart rate noted  Respiratory: Normal respiratory effort, no problems with respiration noted  Abdomen: Soft, gravid, appropriate for gestational age.  Pain/Pressure: Present     Pelvic: Cervical exam deferred        Extremities: Normal range of motion.  Edema: Mild pitting, slight indentation  Mental Status:  Normal mood and affect. Normal behavior. Normal judgment and thought content.   Assessment and Plan:  Pregnancy: G4P1021 at [redacted]w[redacted]d  1. History of cesarean section Pt desires TOLAC  2. Supervision of other normal pregnancy, antepartum Continue routine prenatal care  3. Group B streptococcal infection in pregnancy Abx in labor, penicillin  4. [redacted] weeks gestation of pregnancy   Term labor symptoms and general obstetric precautions including but not limited to vaginal bleeding, contractions, leaking of fluid and fetal movement were reviewed in detail with the patient.  Please refer to After Visit Summary for other counseling recommendations.   Return in about 1 week (around 09/29/2021) for LOB, in person.   Mariel Aloe, MD Faculty Attending Center for [facility]

## 2021-09-30 ENCOUNTER — Ambulatory Visit (INDEPENDENT_AMBULATORY_CARE_PROVIDER_SITE_OTHER): Payer: Non-veteran care

## 2021-09-30 ENCOUNTER — Encounter: Payer: Non-veteran care | Admitting: Obstetrics and Gynecology

## 2021-09-30 VITALS — BP 102/67 | HR 78 | Wt 137.0 lb

## 2021-09-30 DIAGNOSIS — Z3480 Encounter for supervision of other normal pregnancy, unspecified trimester: Secondary | ICD-10-CM

## 2021-09-30 DIAGNOSIS — Z3A38 38 weeks gestation of pregnancy: Secondary | ICD-10-CM

## 2021-09-30 NOTE — Progress Notes (Signed)
Routine prenatal visit

## 2021-09-30 NOTE — Progress Notes (Signed)
   PRENATAL VISIT NOTE  Subjective:  Dawn Richmond is a 31 y.o. G4P1021 at [redacted]w[redacted]d being seen today for ongoing prenatal care.  She is currently monitored for the following issues for this high-risk pregnancy and has Endometriosis; Supervision of other normal pregnancy, antepartum; History of cesarean section; Placenta previa in third trimester; and Group B streptococcal infection in pregnancy on their problem list.  Patient reports no complaints.  Contractions: Irritability. Vag. Bleeding: None.  Movement: Present. Denies leaking of fluid.   The following portions of the patient's history were reviewed and updated as appropriate: allergies, current medications, past family history, past medical history, past social history, past surgical history and problem list.   Objective:   Vitals:   09/30/21 0953  BP: 102/67  Pulse: 78  Weight: 137 lb (62.1 kg)    Fetal Status: Fetal Heart Rate (bpm): 140s Fundal Height: 37 cm Movement: Present     General:  Alert, oriented and cooperative. Patient is in no acute distress.  Skin: Skin is warm and dry. No rash noted.   Cardiovascular: Normal heart rate noted  Respiratory: Normal respiratory effort, no problems with respiration noted  Abdomen: Soft, gravid, appropriate for gestational age.  Pain/Pressure: Present     Pelvic: Cervical exam deferred        Extremities: Normal range of motion.     Mental Status: Normal mood and affect. Normal behavior. Normal judgment and thought content.   Assessment and Plan:  Pregnancy: G4P1021 at [redacted]w[redacted]d 1. Supervision of other normal pregnancy, antepartum - Routine OB. Doing well, no concerns - Anticipatory guidance for upcoming appointments provided  2. [redacted] weeks gestation of pregnancy - FH appropriate - Endorses active fetal movement  Term labor symptoms and general obstetric precautions including but not limited to vaginal bleeding, contractions, leaking of fluid and fetal movement were reviewed in detail  with the patient. Please refer to After Visit Summary for other counseling recommendations.   No follow-ups on file.  Future Appointments  Date Time Provider Department Center  10/07/2021  1:35 PM Madlyn Frankel [facility]  10/14/2021  1:15 PM [facility]  10/14/2021  3:15 PM Reva Bores, MD [facility]    Brand Males, CNM

## 2021-10-02 NOTE — Progress Notes (Unsigned)
   PRENATAL VISIT NOTE  Subjective:  Dawn Richmond is a 31 y.o. G4P1021 at [redacted]w[redacted]d being seen today for ongoing prenatal care.  She is currently monitored for the following issues for this {Blank single:19197::"high-risk","low-risk"} pregnancy and has Endometriosis; Supervision of other normal pregnancy, antepartum; History of cesarean section; Placenta previa in third trimester; and Group B streptococcal infection in pregnancy on their problem list.  Patient reports {[id number]}.   .  .   . Denies leaking of fluid.   The following portions of the patient's history were reviewed and updated as appropriate: allergies, current medications, past family history, past medical history, past social history, past surgical history and problem list.   Objective:  There were no vitals filed for this visit.  Fetal Status:           General:  Alert, oriented and cooperative. Patient is in no acute distress.  Skin: Skin is warm and dry. No rash noted.   Cardiovascular: Normal heart rate noted  Respiratory: Normal respiratory effort, no problems with respiration noted  Abdomen: Soft, gravid, appropriate for gestational age.        Pelvic: {Blank single:19197::"Cervical exam performed in the presence of a chaperone","Cervical exam deferred"}        Extremities: Normal range of motion.     Mental Status: Normal mood and affect. Normal behavior. Normal judgment and thought content.   Assessment and Plan:  Pregnancy: [id number] at [redacted]w[redacted]d There are no diagnoses linked to this encounter. {Blank single:19197::"Term","Preterm"} labor symptoms and general obstetric precautions including but not limited to vaginal bleeding, contractions, leaking of fluid and fetal movement were reviewed in detail with the patient. Please refer to After Visit Summary for other counseling recommendations.   No follow-ups on file.  Future Appointments  Date Time Provider Department Center  10/07/2021  1:35 PM Madlyn Frankel [facility]  10/14/2021  1:15 PM [facility]  10/14/2021  3:15 PM Reva Bores, MD [facility]    Marylene Land, [place]

## 2021-10-03 ENCOUNTER — Encounter: Payer: Self-pay | Admitting: Radiology

## 2021-10-07 ENCOUNTER — Other Ambulatory Visit: Payer: Self-pay

## 2021-10-07 ENCOUNTER — Other Ambulatory Visit (HOSPITAL_COMMUNITY)
Admission: RE | Admit: 2021-10-07 | Discharge: 2021-10-07 | Disposition: A | Discharge status: Home / Self Care | Payer: 59 | Source: Ambulatory Visit | Attending: Student | Admitting: Student

## 2021-10-07 ENCOUNTER — Ambulatory Visit (INDEPENDENT_AMBULATORY_CARE_PROVIDER_SITE_OTHER): Payer: Non-veteran care | Admitting: Student

## 2021-10-07 VITALS — BP 105/68 | HR 72 | Wt 138.0 lb

## 2021-10-07 DIAGNOSIS — N898 Other specified noninflammatory disorders of vagina: Secondary | ICD-10-CM | POA: Diagnosis present

## 2021-10-07 DIAGNOSIS — Z3A39 39 weeks gestation of pregnancy: Secondary | ICD-10-CM

## 2021-10-07 DIAGNOSIS — B3731 Acute candidiasis of vulva and vagina: Secondary | ICD-10-CM | POA: Insufficient documentation

## 2021-10-07 MED ORDER — TERCONAZOLE 80 MG VA SUPP: 80.0000 mg | Freq: Every day | VAGINAL | 0 refills | Status: DC

## 2021-10-07 NOTE — Patient Instructions (Signed)
Congratulations, you're on your way to having your baby!!!   You now have an induction scheduled.   If your induction is scheduled for the daytime, you will see an appointment time in MyChart. Please DO NOT show up at this time, this is just a placeholder on the schedule. You will get a call when your room is ready and will have 2 hours to arrive. The hospital staff can call anytime starting at 5 am through the rest of the day.   You will also get a call from the pre-admission nurse to go over pre-admission screen about 2 days prior to your induction date.      

## 2021-10-07 NOTE — Addendum Note (Signed)
Addended by: Chrystie Nose on: 10/07/2021 02:33 PM   Modules accepted: Orders

## 2021-10-07 NOTE — Progress Notes (Signed)
Patient complains of vaginal itch that developed a few days ago. She denies any odor or discharge and is not sure on whether its regular/normal vaginal itch or possible infection. She would like to do a swab to test for any infections

## 2021-10-08 LAB — CERVICOVAGINAL ANCILLARY ONLY
Bacterial Vaginitis (gardnerella): NEGATIVE
Candida Glabrata: NEGATIVE
Candida Vaginitis: POSITIVE — AB
Chlamydia: NEGATIVE
Comment: NEGATIVE
Comment: NEGATIVE
Comment: NEGATIVE
Comment: NEGATIVE
Comment: NEGATIVE
Comment: NORMAL
Neisseria Gonorrhea: NEGATIVE
Trichomonas: NEGATIVE

## 2021-10-12 ENCOUNTER — Inpatient Hospital Stay (HOSPITAL_COMMUNITY): Admit: 2021-10-12 | Payer: 59

## 2021-10-12 ENCOUNTER — Telehealth (HOSPITAL_COMMUNITY): Payer: Self-pay | Admitting: *Deleted

## 2021-10-12 ENCOUNTER — Encounter (HOSPITAL_COMMUNITY): Payer: Self-pay | Admitting: *Deleted

## 2021-10-12 ENCOUNTER — Encounter: Payer: Self-pay | Admitting: Radiology

## 2021-10-12 NOTE — Telephone Encounter (Signed)
Preadmission screen  

## 2021-10-14 ENCOUNTER — Ambulatory Visit (INDEPENDENT_AMBULATORY_CARE_PROVIDER_SITE_OTHER): Payer: 59

## 2021-10-14 ENCOUNTER — Ambulatory Visit: Payer: Non-veteran care | Admitting: *Deleted

## 2021-10-14 ENCOUNTER — Ambulatory Visit (INDEPENDENT_AMBULATORY_CARE_PROVIDER_SITE_OTHER): Payer: 59 | Admitting: Family Medicine

## 2021-10-14 ENCOUNTER — Other Ambulatory Visit: Payer: Self-pay

## 2021-10-14 VITALS — BP 110/72 | HR 103 | Wt 138.4 lb

## 2021-10-14 DIAGNOSIS — O98819 Other maternal infectious and parasitic diseases complicating pregnancy, unspecified trimester: Secondary | ICD-10-CM

## 2021-10-14 DIAGNOSIS — Z98891 History of uterine scar from previous surgery: Secondary | ICD-10-CM

## 2021-10-14 DIAGNOSIS — O480 Post-term pregnancy: Secondary | ICD-10-CM

## 2021-10-14 DIAGNOSIS — F321 Major depressive disorder, single episode, moderate: Secondary | ICD-10-CM

## 2021-10-14 DIAGNOSIS — Z3480 Encounter for supervision of other normal pregnancy, unspecified trimester: Secondary | ICD-10-CM

## 2021-10-14 DIAGNOSIS — F4312 Post-traumatic stress disorder, chronic: Secondary | ICD-10-CM

## 2021-10-14 DIAGNOSIS — B951 Streptococcus, group B, as the cause of diseases classified elsewhere: Secondary | ICD-10-CM

## 2021-10-14 NOTE — Progress Notes (Signed)
IOL scheduled on 9/5

## 2021-10-14 NOTE — Progress Notes (Signed)
   PRENATAL VISIT NOTE  Subjective:  Dawn Richmond is a 31 y.o. G4P1021 at [redacted]w[redacted]d being seen today for ongoing prenatal care.  She is currently monitored for the following issues for this low-risk pregnancy and has Endometriosis; Supervision of other normal pregnancy, antepartum; History of cesarean section; Placenta previa in third trimester; Group B streptococcal infection in pregnancy; Adjustment disorder with depressed mood; Chronic bilateral low back pain without sciatica; Closed displaced transverse fracture of shaft of right femur (HCC); Eczema; Insomnia, unspecified; Moderate major depression, single episode (HCC); and Post-traumatic stress disorder, chronic on their problem list.  Patient reports no complaints.  Contractions: Irregular. Vag. Bleeding: None.  Movement: Present. Denies leaking of fluid.   The following portions of the patient's history were reviewed and updated as appropriate: allergies, current medications, past family history, past medical history, past social history, past surgical history and problem list.   Objective:   Vitals:   10/14/21 1353  BP: 110/72  Pulse: (!) 103  Weight: 138 lb 6.4 oz (62.8 kg)    Fetal Status: Fetal Heart Rate (bpm): NST   Movement: Present     General:  Alert, oriented and cooperative. Patient is in no acute distress.  Skin: Skin is warm and dry. No rash noted.   Cardiovascular: Normal heart rate noted  Respiratory: Normal respiratory effort, no problems with respiration noted  Abdomen: Soft, gravid, appropriate for gestational age.  Pain/Pressure: Present     Pelvic: Cervical exam performed in the presence of a chaperone Dilation: 1 Effacement (%): 10, 20 Station: Ballotable  Extremities: Normal range of motion.     Mental Status: Normal mood and affect. Normal behavior. Normal judgment and thought content.  NST:  Baseline: 150 bpm, Variability: Good {> 6 bpm), Accelerations: Reactive, and Decelerations: Absent  Assessment and  Plan:  Pregnancy: G4P1021 at [redacted]w[redacted]d 1. Supervision of other normal pregnancy, antepartum For IOL at 41 weeks  2. Post term pregnancy, antepartum condition or complication Normal BPP today  3. History of cesarean section For TOLAC  4. Group B streptococcal infection in pregnancy Needs ppx during labor  5. Moderate major depression, single episode (HCC) Has Zoloft through [place] --will begin after baby comes, feels safe  6. Post-traumatic stress disorder, chronic See above  Term labor symptoms and general obstetric precautions including but not limited to vaginal bleeding, contractions, leaking of fluid and fetal movement were reviewed in detail with the patient. Please refer to After Visit Summary for other counseling recommendations.   No follow-ups on file.  Future Appointments  Date Time Provider Department Center  10/19/2021  7:45 AM [facility] SCHED ROOM [facility] None    Reva Bores, MD

## 2021-10-16 ENCOUNTER — Other Ambulatory Visit: Payer: Self-pay | Admitting: Advanced Practice Midwife

## 2021-10-18 ENCOUNTER — Other Ambulatory Visit: Payer: Self-pay

## 2021-10-18 ENCOUNTER — Inpatient Hospital Stay (HOSPITAL_COMMUNITY)
Admission: AD | Admit: 2021-10-18 | Discharge: 2021-10-22 | DRG: 787 | Disposition: A | Discharge status: Home / Self Care | Payer: No Typology Code available for payment source | Attending: Obstetrics & Gynecology | Admitting: Obstetrics & Gynecology

## 2021-10-18 ENCOUNTER — Encounter (HOSPITAL_COMMUNITY): Payer: Self-pay | Admitting: Obstetrics & Gynecology

## 2021-10-18 DIAGNOSIS — Z3480 Encounter for supervision of other normal pregnancy, unspecified trimester: Secondary | ICD-10-CM

## 2021-10-18 DIAGNOSIS — D62 Acute posthemorrhagic anemia: Secondary | ICD-10-CM | POA: Diagnosis not present

## 2021-10-18 DIAGNOSIS — Z3A40 40 weeks gestation of pregnancy: Secondary | ICD-10-CM | POA: Diagnosis not present

## 2021-10-18 DIAGNOSIS — Z87891 Personal history of nicotine dependence: Secondary | ICD-10-CM

## 2021-10-18 DIAGNOSIS — O480 Post-term pregnancy: Secondary | ICD-10-CM | POA: Diagnosis present

## 2021-10-18 DIAGNOSIS — O99344 Other mental disorders complicating childbirth: Secondary | ICD-10-CM | POA: Diagnosis present

## 2021-10-18 DIAGNOSIS — O99824 Streptococcus B carrier state complicating childbirth: Secondary | ICD-10-CM | POA: Diagnosis present

## 2021-10-18 DIAGNOSIS — F321 Major depressive disorder, single episode, moderate: Secondary | ICD-10-CM | POA: Diagnosis present

## 2021-10-18 DIAGNOSIS — O34211 Maternal care for low transverse scar from previous cesarean delivery: Secondary | ICD-10-CM | POA: Diagnosis present

## 2021-10-18 DIAGNOSIS — O368130 Decreased fetal movements, third trimester, not applicable or unspecified: Secondary | ICD-10-CM | POA: Diagnosis present

## 2021-10-18 DIAGNOSIS — Z98891 History of uterine scar from previous surgery: Principal | ICD-10-CM

## 2021-10-18 DIAGNOSIS — F4312 Post-traumatic stress disorder, chronic: Secondary | ICD-10-CM | POA: Diagnosis present

## 2021-10-18 DIAGNOSIS — Z3A41 41 weeks gestation of pregnancy: Secondary | ICD-10-CM | POA: Diagnosis not present

## 2021-10-18 DIAGNOSIS — O9081 Anemia of the puerperium: Secondary | ICD-10-CM | POA: Diagnosis not present

## 2021-10-18 DIAGNOSIS — B951 Streptococcus, group B, as the cause of diseases classified elsewhere: Secondary | ICD-10-CM

## 2021-10-18 DIAGNOSIS — Z3A39 39 weeks gestation of pregnancy: Secondary | ICD-10-CM

## 2021-10-18 LAB — TYPE AND SCREEN
ABO/RH(D): O POS
Antibody Screen: NEGATIVE

## 2021-10-18 LAB — CBC
HCT: 34.7 % — ABNORMAL LOW (ref 36.0–46.0)
Hemoglobin: 11.2 g/dL — ABNORMAL LOW (ref 12.0–15.0)
MCH: 27.2 pg (ref 26.0–34.0)
MCHC: 32.3 g/dL (ref 30.0–36.0)
MCV: 84.2 fL (ref 80.0–100.0)
Platelets: 137 10*3/uL — ABNORMAL LOW (ref 150–400)
RBC: 4.12 MIL/uL (ref 3.87–5.11)
RDW: 14.3 % (ref 11.5–15.5)
WBC: 7.5 10*3/uL (ref 4.0–10.5)
nRBC: 0 % (ref 0.0–0.2)

## 2021-10-18 MED ORDER — TERBUTALINE SULFATE 1 MG/ML IJ SOLN
0.2500 mg | Freq: Once | INTRAMUSCULAR | Status: AC | PRN
  Administered 2021-10-19: 0.25 mg via SUBCUTANEOUS
  Filled 2021-10-18: qty 1

## 2021-10-18 MED ORDER — ONDANSETRON HCL 4 MG/2ML IJ SOLN
4.0000 mg | Freq: Four times a day (QID) | INTRAMUSCULAR | Status: DC | PRN
  Administered 2021-10-19 (×2): 4 mg via INTRAVENOUS
  Filled 2021-10-18: qty 2

## 2021-10-18 MED ORDER — OXYCODONE-ACETAMINOPHEN 5-325 MG PO TABS: 2.0000 | ORAL_TABLET | ORAL | Status: DC | PRN

## 2021-10-18 MED ORDER — LACTATED RINGERS IV SOLN
500.0000 mL | INTRAVENOUS | Status: DC | PRN
  Administered 2021-10-18 – 2021-10-19 (×2): 500 mL via INTRAVENOUS

## 2021-10-18 MED ORDER — PENICILLIN G POT IN DEXTROSE 60000 UNIT/ML IV SOLN
3.0000 10*6.[IU] | INTRAVENOUS | Status: DC
  Administered 2021-10-18 – 2021-10-19 (×4): 3 10*6.[IU] via INTRAVENOUS
  Filled 2021-10-18 (×10): qty 50

## 2021-10-18 MED ORDER — ACETAMINOPHEN 325 MG PO TABS: 650.0000 mg | ORAL_TABLET | ORAL | Status: DC | PRN

## 2021-10-18 MED ORDER — OXYCODONE-ACETAMINOPHEN 5-325 MG PO TABS: 1.0000 | ORAL_TABLET | ORAL | Status: DC | PRN

## 2021-10-18 MED ORDER — OXYTOCIN BOLUS FROM INFUSION: 333.0000 mL | Freq: Once | INTRAVENOUS | Status: DC

## 2021-10-18 MED ORDER — SOD CITRATE-CITRIC ACID 500-334 MG/5ML PO SOLN
30.0000 mL | ORAL | Status: DC | PRN
  Filled 2021-10-18: qty 30

## 2021-10-18 MED ORDER — LIDOCAINE HCL (PF) 1 % IJ SOLN: 30.0000 mL | INTRAMUSCULAR | Status: DC | PRN

## 2021-10-18 MED ORDER — SODIUM CHLORIDE 0.9 % IV SOLN
5.0000 10*6.[IU] | Freq: Once | INTRAVENOUS | Status: AC
  Administered 2021-10-18: 5 10*6.[IU] via INTRAVENOUS
  Filled 2021-10-18: qty 5

## 2021-10-18 MED ORDER — OXYTOCIN-SODIUM CHLORIDE 30-0.9 UT/500ML-% IV SOLN
2.5000 [IU]/h | INTRAVENOUS | Status: DC
  Filled 2021-10-18: qty 500

## 2021-10-18 MED ORDER — LACTATED RINGERS IV SOLN: INTRAVENOUS | Status: DC

## 2021-10-18 MED ORDER — FENTANYL CITRATE (PF) 100 MCG/2ML IJ SOLN
100.0000 ug | INTRAMUSCULAR | Status: DC | PRN
  Administered 2021-10-19 (×2): 100 ug via INTRAVENOUS
  Filled 2021-10-18 (×2): qty 2

## 2021-10-18 MED ORDER — PENICILLIN G POT IN DEXTROSE 60000 UNIT/ML IV SOLN
3.0000 10*6.[IU] | INTRAVENOUS | Status: DC
  Administered 2021-10-18: 3 10*6.[IU] via INTRAVENOUS
  Filled 2021-10-18: qty 50

## 2021-10-18 NOTE — MAU Note (Signed)
Attempt x 2 made to initiate iv (left forearm, right upper forearm) both with immediate blood return and both began swelling as soon as fluid started. Both dc'ed immediately and pressure applied with dressing. Able to draw labs from initial stick without problems. Above reported to L/D charge

## 2021-10-18 NOTE — H&P (Signed)
Dawn Richmond is a 31 y.o. G71P1021 female at [redacted]w[redacted]d by LMP c/w 9wk u/s, presenting for decreased to absent fetal movement since last night.   Reports decreased  fetal movement, contractions: q 5-36mins- reports she has been having for a few days- more intense since got to MAU, vaginal bleeding: none, membranes: intact.  Initiated prenatal care at [facility] at 23 wks.   Most recent u/s postdates BPP 8/31 @ 40.2wks, BPP 6/8 (-2 for breathing), reactive NST=8/10.   This pregnancy complicated by: Placenta previa- resolved 09/02/21 GBS+  Prenatal History/Complications:  SAB x 1, EAB x 1, PCS 2010 @ 40.4 for arrest of descent and chorioamnionitis after 2.5hr second stage, baby weight 6lb10oz  Past Medical History: Past Medical History:  Diagnosis Date   Anxiety    Asthma    Depression     Past Surgical History: Past Surgical History:  Procedure Laterality Date   CESAREAN SECTION     FEMUR FRACTURE SURGERY     INDUCED ABORTION     THERAPEUTIC ABORTION  august 3rd 2013   WISDOM TOOTH EXTRACTION      Obstetrical History: OB History     Gravida  4   Para  1   Term  1   Preterm      AB  2   Living  1      SAB  1   IAB  1   Ectopic      Multiple      Live Births  1           Social History: Social History   Socioeconomic History   Marital status: Divorced    Spouse name: Not on file   Number of children: Not on file   Years of education: Not on file   Highest education level: Not on file  Occupational History   Not on file  Tobacco Use   Smoking status: Former    Types: Cigarettes, Cigars    Quit date: 01/27/2021    Years since quitting: 0.7   Smokeless tobacco: Not on file  Vaping Use   Vaping Use: Never used  Substance and Sexual Activity   Alcohol use: Not Currently    Comment: NOT SINCE PREG   Drug use: Not Currently    Types: Marijuana    Comment: LAST USE 2011   Sexual activity: Yes    Birth control/protection: None  Other Topics Concern   Not  on file  Social History Narrative   Not on file   Social Determinants of Health   Financial Resource Strain: Not on file  Food Insecurity: No Food Insecurity (09/22/2021)   Hunger Vital Sign    Worried About Running Out of Food in the Last Year: Never true    Ran Out of Food in the Last Year: Never true  Transportation Needs: No Transportation Needs (09/22/2021)   PRAPARE - Administrator, Civil Service (Medical): No    Lack of Transportation (Non-Medical): No  Physical Activity: Not on file  Stress: Not on file  Social Connections: Not on file    Family History: Family History  Problem Relation Age of Onset   Asthma Mother    Diabetes Mother    Hypertension Mother    Glaucoma Mother    Asthma Brother    Asthma Daughter    Diabetes Maternal Aunt    Cancer Maternal Aunt    Diabetes Maternal Grandmother    Heart disease Neg Hx  Kidney disease Neg Hx    Stroke Neg Hx     Allergies: No Known Allergies  Medications Prior to Admission  Medication Sig Dispense Refill Last Dose   pimecrolimus (ELIDEL) 1 % cream Apply topically 2 (two) times daily.   10/17/2021   prenatal vitamin w/FE, FA (PRENATAL 1 + 1) 27-1 MG TABS tablet Take 1 tablet by mouth daily at 12 noon.   10/17/2021   pimecrolimus (ELIDEL) 1 % cream Apply topically 2 (two) times daily.       Review of Systems  Pertinent pos/neg as indicated in HPI  Blood pressure 116/73, pulse 82, temperature 98.1 F (36.7 C), temperature source Oral, resp. rate 16, height 4\' 11"  (1.499 m), weight 63 kg, last menstrual period 01/05/2021, SpO2 100 %. General appearance: alert, cooperative, and no distress Lungs: clear to auscultation bilaterally Heart: regular rate and rhythm Abdomen: gravid, soft, non-tender Extremities: tr edema SVE: 1.5/th/-3, vtx  Cervical foley bulb inserted and inflated w/ 30ml LR w/o difficulty   Fetal monitoring: FHR: 145 bpm, variability: moderate,  Accelerations: Present,  decelerations:   Absent Uterine activity: q 4-5 mins   Presentation: cephalic   Prenatal labs: ABO, Rh:  O+ Antibody: Negative (01/27 0000) Rubella: Immune (01/27 0000) RPR: Non Reactive (06/05 0835)  HBsAg:   neg HIV: Non Reactive (06/05 0835)  GBS: Positive/-- (08/01 1638)  2hr GTT: normal  No results found for this or any previous visit (from the past 24 hour(s)).   Assessment:  [redacted]w[redacted]d SIUP  G4P1021  Decreased/absent fetal movement at 40.6  Cat 1 FHR  GBS Positive/-- (08/01 1638)  Prev c/s for AOD after 2.5hr 2nd stage, wants TOLAC  Plan:  Admit to L&D  IV pain meds/epidural prn active labor  Reviewed risks/benefits of TOLAC, consent signed  Foley bulb in, wishes to avoid pitocin if possible  Anticipate VBAC   Plans to breastfeed  Contraception: none from 10-31-1974  Circumcision: yes  [place] CNM, WHNP-BC 10/18/2021, 11:53 AM

## 2021-10-18 NOTE — Progress Notes (Signed)
Labor Progress Note Dawn Richmond is a 31 y.o. G4P1021 at [redacted]w[redacted]d presented for TOLAC due to decreased to absent fetal movement since night of  10/17/21.   S: Dawn Richmond is doing well. Feeling mild contractions.   O:  BP 108/61   Pulse 86   Temp 98.3 F (36.8 C) (Oral)   Resp 15   Ht 4\' 11"  (1.499 m)   Wt 63 kg   LMP 01/05/2021   SpO2 100% Comment: room air  BMI 28.05 kg/m  EFM: 155/moderate variability/(+) acels, no decelerations  CVE: Dilation: 2.5 Effacement (%): 40 Station: -3 Presentation: Vertex Exam by:: [email or web address] RN   A&P: 31 y.o. 32 [redacted]w[redacted]d presenting for TOLAC due to decreased fetal movement #Labor: Progressing well. Foley bulb removed. Cervical exam as above. Discussed augmentation with pitocin, which at this time patient declines. Continue expectant management.  Anticipate VBAC #Pain: Well controlled. Planning on epidural #FWB: Cat I #GBS positive (receiving PCN)  Dawn Richmond [redacted]w[redacted]d, MD Resident Physician 9:02 PM

## 2021-10-18 NOTE — MAU Note (Signed)
Dawn Richmond is a 31 y.o. at [redacted]w[redacted]d here in MAU reporting: DFM since this morning. Having irregular contractions. Denies bleeding or LOF.   Onset of complaint: today  Pain score: 3/10  Vitals:   10/18/21 1124  BP: 116/73  Pulse: 82  Resp: 16  Temp: 98.1 F (36.7 C)  SpO2: 100%     FHT:144  Lab orders placed from triage: none

## 2021-10-19 ENCOUNTER — Inpatient Hospital Stay (HOSPITAL_COMMUNITY)
Admission: AD | Admit: 2021-10-19 | Payer: No Typology Code available for payment source | Source: Home / Self Care | Admitting: Obstetrics and Gynecology

## 2021-10-19 ENCOUNTER — Inpatient Hospital Stay (HOSPITAL_COMMUNITY): Payer: No Typology Code available for payment source | Admitting: Anesthesiology

## 2021-10-19 ENCOUNTER — Inpatient Hospital Stay (HOSPITAL_COMMUNITY): Payer: No Typology Code available for payment source

## 2021-10-19 ENCOUNTER — Encounter (HOSPITAL_COMMUNITY)
Admission: AD | Disposition: A | Discharge status: Home / Self Care | Payer: Self-pay | Source: Home / Self Care | Attending: Obstetrics & Gynecology

## 2021-10-19 ENCOUNTER — Encounter (HOSPITAL_COMMUNITY): Payer: Self-pay | Admitting: Student

## 2021-10-19 DIAGNOSIS — Z3A41 41 weeks gestation of pregnancy: Secondary | ICD-10-CM

## 2021-10-19 DIAGNOSIS — O99824 Streptococcus B carrier state complicating childbirth: Secondary | ICD-10-CM | POA: Diagnosis not present

## 2021-10-19 DIAGNOSIS — Z98891 History of uterine scar from previous surgery: Secondary | ICD-10-CM

## 2021-10-19 DIAGNOSIS — Z3A40 40 weeks gestation of pregnancy: Secondary | ICD-10-CM | POA: Diagnosis not present

## 2021-10-19 DIAGNOSIS — O34211 Maternal care for low transverse scar from previous cesarean delivery: Secondary | ICD-10-CM

## 2021-10-19 DIAGNOSIS — O480 Post-term pregnancy: Secondary | ICD-10-CM | POA: Diagnosis not present

## 2021-10-19 LAB — CBC
HCT: 33 % — ABNORMAL LOW (ref 36.0–46.0)
Hemoglobin: 11 g/dL — ABNORMAL LOW (ref 12.0–15.0)
MCH: 27.6 pg (ref 26.0–34.0)
MCHC: 33.3 g/dL (ref 30.0–36.0)
MCV: 82.7 fL (ref 80.0–100.0)
Platelets: 181 10*3/uL (ref 150–400)
RBC: 3.99 MIL/uL (ref 3.87–5.11)
RDW: 14 % (ref 11.5–15.5)
WBC: 22.2 10*3/uL — ABNORMAL HIGH (ref 4.0–10.5)
nRBC: 0 % (ref 0.0–0.2)

## 2021-10-19 LAB — RPR: RPR Ser Ql: NONREACTIVE

## 2021-10-19 SURGERY — Surgical Case
Anesthesia: Epidural

## 2021-10-19 MED ORDER — TRANEXAMIC ACID-NACL 1000-0.7 MG/100ML-% IV SOLN
INTRAVENOUS | Status: AC
  Filled 2021-10-19: qty 100

## 2021-10-19 MED ORDER — SENNOSIDES-DOCUSATE SODIUM 8.6-50 MG PO TABS
2.0000 | ORAL_TABLET | Freq: Every day | ORAL | Status: DC
  Administered 2021-10-20 – 2021-10-22 (×3): 2 via ORAL
  Filled 2021-10-19 (×3): qty 2

## 2021-10-19 MED ORDER — KETOROLAC TROMETHAMINE 30 MG/ML IJ SOLN
30.0000 mg | Freq: Four times a day (QID) | INTRAMUSCULAR | Status: AC
  Administered 2021-10-19 – 2021-10-20 (×2): 30 mg via INTRAVENOUS
  Filled 2021-10-19 (×3): qty 1

## 2021-10-19 MED ORDER — ZOLPIDEM TARTRATE 5 MG PO TABS: 5.0000 mg | ORAL_TABLET | Freq: Every evening | ORAL | Status: DC | PRN

## 2021-10-19 MED ORDER — LIDOCAINE-EPINEPHRINE (PF) 2 %-1:200000 IJ SOLN
INTRAMUSCULAR | Status: DC | PRN
  Administered 2021-10-19 (×4): 5 mL via EPIDURAL

## 2021-10-19 MED ORDER — SODIUM CHLORIDE 0.9 % IV SOLN
500.0000 mg | INTRAVENOUS | Status: AC
  Administered 2021-10-19: 500 mg via INTRAVENOUS

## 2021-10-19 MED ORDER — PRENATAL MULTIVITAMIN CH
1.0000 | ORAL_TABLET | Freq: Every day | ORAL | Status: DC
  Administered 2021-10-20 – 2021-10-22 (×3): 1 via ORAL
  Filled 2021-10-19 (×3): qty 1

## 2021-10-19 MED ORDER — NALOXONE HCL 4 MG/10ML IJ SOLN: 1.0000 ug/kg/h | INTRAVENOUS | Status: DC | PRN

## 2021-10-19 MED ORDER — CEFAZOLIN SODIUM-DEXTROSE 2-4 GM/100ML-% IV SOLN
2.0000 g | INTRAVENOUS | Status: AC
  Administered 2021-10-19: 2 g via INTRAVENOUS

## 2021-10-19 MED ORDER — MORPHINE SULFATE (PF) 0.5 MG/ML IJ SOLN
INTRAMUSCULAR | Status: DC | PRN
  Administered 2021-10-19: 3 mg via EPIDURAL

## 2021-10-19 MED ORDER — SIMETHICONE 80 MG PO CHEW
80.0000 mg | CHEWABLE_TABLET | Freq: Three times a day (TID) | ORAL | Status: DC
  Administered 2021-10-20 – 2021-10-22 (×6): 80 mg via ORAL
  Filled 2021-10-19 (×6): qty 1

## 2021-10-19 MED ORDER — MEPERIDINE HCL 25 MG/ML IJ SOLN: 6.2500 mg | INTRAMUSCULAR | Status: DC | PRN

## 2021-10-19 MED ORDER — TRANEXAMIC ACID-NACL 1000-0.7 MG/100ML-% IV SOLN
INTRAVENOUS | Status: DC | PRN
  Administered 2021-10-19: 1000 mg via INTRAVENOUS

## 2021-10-19 MED ORDER — IBUPROFEN 600 MG PO TABS
600.0000 mg | ORAL_TABLET | Freq: Four times a day (QID) | ORAL | Status: DC
  Administered 2021-10-20 – 2021-10-22 (×8): 600 mg via ORAL
  Filled 2021-10-19 (×9): qty 1

## 2021-10-19 MED ORDER — ONDANSETRON HCL 4 MG/2ML IJ SOLN
INTRAMUSCULAR | Status: AC
  Filled 2021-10-19: qty 2

## 2021-10-19 MED ORDER — STERILE WATER FOR IRRIGATION IR SOLN
Status: DC | PRN
  Administered 2021-10-19: 1000 mL

## 2021-10-19 MED ORDER — KETOROLAC TROMETHAMINE 30 MG/ML IJ SOLN: 30.0000 mg | Freq: Four times a day (QID) | INTRAMUSCULAR | Status: DC | PRN

## 2021-10-19 MED ORDER — OXYTOCIN-SODIUM CHLORIDE 30-0.9 UT/500ML-% IV SOLN
INTRAVENOUS | Status: AC
  Filled 2021-10-19: qty 500

## 2021-10-19 MED ORDER — COCONUT OIL OIL: 1.0000 | TOPICAL_OIL | Status: DC | PRN

## 2021-10-19 MED ORDER — DIPHENHYDRAMINE HCL 25 MG PO CAPS: 25.0000 mg | ORAL_CAPSULE | ORAL | Status: DC | PRN

## 2021-10-19 MED ORDER — OXYTOCIN-SODIUM CHLORIDE 30-0.9 UT/500ML-% IV SOLN
1.0000 m[IU]/min | INTRAVENOUS | Status: DC
  Administered 2021-10-19: 2 m[IU]/min via INTRAVENOUS

## 2021-10-19 MED ORDER — ONDANSETRON HCL 4 MG/2ML IJ SOLN: 4.0000 mg | Freq: Three times a day (TID) | INTRAMUSCULAR | Status: DC | PRN

## 2021-10-19 MED ORDER — LACTATED RINGERS IV SOLN: INTRAVENOUS | Status: DC

## 2021-10-19 MED ORDER — OXYCODONE HCL 5 MG PO TABS
5.0000 mg | ORAL_TABLET | ORAL | Status: DC | PRN
  Administered 2021-10-21: 10 mg via ORAL
  Filled 2021-10-19 (×2): qty 2

## 2021-10-19 MED ORDER — PROMETHAZINE HCL 25 MG/ML IJ SOLN: 6.2500 mg | INTRAMUSCULAR | Status: DC | PRN

## 2021-10-19 MED ORDER — MAGNESIUM HYDROXIDE 400 MG/5ML PO SUSP: 30.0000 mL | ORAL | Status: DC | PRN

## 2021-10-19 MED ORDER — OXYTOCIN-SODIUM CHLORIDE 30-0.9 UT/500ML-% IV SOLN: 2.5000 [IU]/h | INTRAVENOUS | Status: AC

## 2021-10-19 MED ORDER — MENTHOL 3 MG MT LOZG: 1.0000 | LOZENGE | OROMUCOSAL | Status: DC | PRN

## 2021-10-19 MED ORDER — ACETAMINOPHEN 10 MG/ML IV SOLN
INTRAVENOUS | Status: DC | PRN
  Administered 2021-10-19: 1000 mg via INTRAVENOUS

## 2021-10-19 MED ORDER — FENTANYL-BUPIVACAINE-NACL 0.5-0.125-0.9 MG/250ML-% EP SOLN
12.0000 mL/h | EPIDURAL | Status: DC | PRN
  Administered 2021-10-19: 12 mL/h via EPIDURAL
  Filled 2021-10-19: qty 250

## 2021-10-19 MED ORDER — MORPHINE SULFATE (PF) 0.5 MG/ML IJ SOLN
INTRAMUSCULAR | Status: AC
  Filled 2021-10-19: qty 10

## 2021-10-19 MED ORDER — SCOPOLAMINE 1 MG/3DAYS TD PT72
MEDICATED_PATCH | TRANSDERMAL | Status: AC
  Filled 2021-10-19: qty 1

## 2021-10-19 MED ORDER — LIDOCAINE HCL (PF) 1 % IJ SOLN
INTRAMUSCULAR | Status: DC | PRN
  Administered 2021-10-19 (×2): 4 mL via EPIDURAL

## 2021-10-19 MED ORDER — FENTANYL CITRATE (PF) 100 MCG/2ML IJ SOLN
INTRAMUSCULAR | Status: AC
  Filled 2021-10-19: qty 2

## 2021-10-19 MED ORDER — KETOROLAC TROMETHAMINE 30 MG/ML IJ SOLN
30.0000 mg | Freq: Four times a day (QID) | INTRAMUSCULAR | Status: DC | PRN
  Administered 2021-10-19: 30 mg via INTRAMUSCULAR

## 2021-10-19 MED ORDER — TERBUTALINE SULFATE 1 MG/ML IJ SOLN
0.2500 mg | Freq: Once | INTRAMUSCULAR | Status: AC | PRN
  Administered 2021-10-19: 0.25 mg via SUBCUTANEOUS

## 2021-10-19 MED ORDER — SIMETHICONE 80 MG PO CHEW: 80.0000 mg | CHEWABLE_TABLET | ORAL | Status: DC | PRN

## 2021-10-19 MED ORDER — EPHEDRINE 5 MG/ML INJ: 10.0000 mg | INTRAVENOUS | Status: DC | PRN

## 2021-10-19 MED ORDER — ENOXAPARIN SODIUM 40 MG/0.4ML IJ SOSY
40.0000 mg | PREFILLED_SYRINGE | INTRAMUSCULAR | Status: DC
  Administered 2021-10-20 – 2021-10-22 (×3): 40 mg via SUBCUTANEOUS
  Filled 2021-10-19 (×3): qty 0.4

## 2021-10-19 MED ORDER — ACETAMINOPHEN 10 MG/ML IV SOLN: 1000.0000 mg | Freq: Once | INTRAVENOUS | Status: DC | PRN

## 2021-10-19 MED ORDER — TRANEXAMIC ACID-NACL 1000-0.7 MG/100ML-% IV SOLN: 1000.0000 mg | INTRAVENOUS | Status: AC

## 2021-10-19 MED ORDER — GABAPENTIN 100 MG PO CAPS
200.0000 mg | ORAL_CAPSULE | Freq: Every day | ORAL | Status: DC
  Administered 2021-10-19 – 2021-10-21 (×3): 200 mg via ORAL
  Filled 2021-10-19 (×3): qty 2

## 2021-10-19 MED ORDER — NALOXONE HCL 0.4 MG/ML IJ SOLN: 0.4000 mg | INTRAMUSCULAR | Status: DC | PRN

## 2021-10-19 MED ORDER — SOD CITRATE-CITRIC ACID 500-334 MG/5ML PO SOLN: 30.0000 mL | ORAL | Status: DC

## 2021-10-19 MED ORDER — WITCH HAZEL-GLYCERIN EX PADS: 1.0000 | MEDICATED_PAD | CUTANEOUS | Status: DC | PRN

## 2021-10-19 MED ORDER — DIPHENHYDRAMINE HCL 50 MG/ML IJ SOLN: 12.5000 mg | INTRAMUSCULAR | Status: DC | PRN

## 2021-10-19 MED ORDER — OXYTOCIN-SODIUM CHLORIDE 30-0.9 UT/500ML-% IV SOLN
INTRAVENOUS | Status: DC | PRN
  Administered 2021-10-19: 400 mL via INTRAVENOUS

## 2021-10-19 MED ORDER — FENTANYL CITRATE (PF) 100 MCG/2ML IJ SOLN
INTRAMUSCULAR | Status: DC | PRN
  Administered 2021-10-19: 100 ug via EPIDURAL

## 2021-10-19 MED ORDER — CEFAZOLIN SODIUM-DEXTROSE 2-4 GM/100ML-% IV SOLN
INTRAVENOUS | Status: AC
  Filled 2021-10-19: qty 100

## 2021-10-19 MED ORDER — SODIUM CHLORIDE 0.9% FLUSH
3.0000 mL | INTRAVENOUS | Status: DC | PRN
Start: 2021-10-19 — End: 2021-10-22

## 2021-10-19 MED ORDER — SODIUM CHLORIDE 0.9 % IR SOLN
Status: DC | PRN
  Administered 2021-10-19: 1

## 2021-10-19 MED ORDER — PHENYLEPHRINE 80 MCG/ML (10ML) SYRINGE FOR IV PUSH (FOR BLOOD PRESSURE SUPPORT)
80.0000 ug | PREFILLED_SYRINGE | INTRAVENOUS | Status: DC | PRN
  Administered 2021-10-19 (×2): 80 ug via INTRAVENOUS

## 2021-10-19 MED ORDER — ACETAMINOPHEN 500 MG PO TABS
1000.0000 mg | ORAL_TABLET | Freq: Four times a day (QID) | ORAL | Status: DC
  Administered 2021-10-19 – 2021-10-22 (×10): 1000 mg via ORAL
  Filled 2021-10-19 (×10): qty 2

## 2021-10-19 MED ORDER — PHENYLEPHRINE 80 MCG/ML (10ML) SYRINGE FOR IV PUSH (FOR BLOOD PRESSURE SUPPORT): 80.0000 ug | PREFILLED_SYRINGE | INTRAVENOUS | Status: DC | PRN

## 2021-10-19 MED ORDER — DIBUCAINE (PERIANAL) 1 % EX OINT: 1.0000 | TOPICAL_OINTMENT | CUTANEOUS | Status: DC | PRN

## 2021-10-19 MED ORDER — SCOPOLAMINE 1 MG/3DAYS TD PT72
1.0000 | MEDICATED_PATCH | Freq: Once | TRANSDERMAL | Status: AC
  Administered 2021-10-19: 1.5 mg via TRANSDERMAL

## 2021-10-19 MED ORDER — OXYTOCIN-SODIUM CHLORIDE 30-0.9 UT/500ML-% IV SOLN: 1.0000 m[IU]/min | INTRAVENOUS | Status: DC

## 2021-10-19 MED ORDER — DIPHENHYDRAMINE HCL 25 MG PO CAPS: 25.0000 mg | ORAL_CAPSULE | Freq: Four times a day (QID) | ORAL | Status: DC | PRN

## 2021-10-19 MED ORDER — KETOROLAC TROMETHAMINE 30 MG/ML IJ SOLN
INTRAMUSCULAR | Status: AC
  Filled 2021-10-19: qty 1

## 2021-10-19 MED ORDER — DEXAMETHASONE SODIUM PHOSPHATE 4 MG/ML IJ SOLN
INTRAMUSCULAR | Status: AC
  Filled 2021-10-19: qty 1

## 2021-10-19 MED ORDER — LACTATED RINGERS IV SOLN
500.0000 mL | Freq: Once | INTRAVENOUS | Status: AC
  Administered 2021-10-19: 500 mL via INTRAVENOUS

## 2021-10-19 MED ORDER — HYDROMORPHONE HCL 1 MG/ML IJ SOLN: 0.2500 mg | INTRAMUSCULAR | Status: DC | PRN

## 2021-10-19 MED ORDER — ACETAMINOPHEN 500 MG PO TABS: 1000.0000 mg | ORAL_TABLET | Freq: Four times a day (QID) | ORAL | Status: DC

## 2021-10-19 MED ORDER — OXYCODONE HCL 5 MG PO TABS
5.0000 mg | ORAL_TABLET | Freq: Four times a day (QID) | ORAL | Status: DC | PRN
  Administered 2021-10-20 – 2021-10-21 (×3): 5 mg via ORAL
  Filled 2021-10-19 (×3): qty 1

## 2021-10-19 SURGICAL SUPPLY — 42 items
ADH SKN CLS APL DERMABOND .7 (GAUZE/BANDAGES/DRESSINGS) ×2
ADH SKN CLS LQ APL DERMABOND (GAUZE/BANDAGES/DRESSINGS) ×2
APL SKNCLS STERI-STRIP NONHPOA (GAUZE/BANDAGES/DRESSINGS) ×1
BENZOIN TINCTURE PRP APPL 2/3 (GAUZE/BANDAGES/DRESSINGS) IMPLANT
CHLORAPREP W/TINT 26ML (MISCELLANEOUS) ×2 IMPLANT
CLAMP CORD UMBIL (MISCELLANEOUS) ×1 IMPLANT
CLOTH BEACON ORANGE TIMEOUT ST (SAFETY) ×1 IMPLANT
DERMABOND ADHESIVE PROPEN (GAUZE/BANDAGES/DRESSINGS) ×2
DERMABOND ADVANCED (GAUZE/BANDAGES/DRESSINGS) ×2
DERMABOND ADVANCED .7 DNX12 (GAUZE/BANDAGES/DRESSINGS) ×2 IMPLANT
DERMABOND ADVANCED .7 DNX6 (GAUZE/BANDAGES/DRESSINGS) IMPLANT
DRSG OPSITE POSTOP 4X10 (GAUZE/BANDAGES/DRESSINGS) ×1 IMPLANT
ELECT REM PT RETURN 9FT ADLT (ELECTROSURGICAL) ×1
ELECTRODE REM PT RTRN 9FT ADLT (ELECTROSURGICAL) ×1 IMPLANT
EXTRACTOR VACUUM M CUP 4 TUBE (SUCTIONS) IMPLANT
GAUZE SPONGE 4X4 12PLY STRL LF (GAUZE/BANDAGES/DRESSINGS) IMPLANT
GLOVE BIOGEL PI IND STRL 7.0 (GLOVE) ×2 IMPLANT
GLOVE BIOGEL PI IND STRL 7.5 (GLOVE) ×2 IMPLANT
GLOVE BIOGEL PI INDICATOR 7.0 (GLOVE) ×2
GLOVE BIOGEL PI INDICATOR 7.5 (GLOVE) ×2
GLOVE ECLIPSE 7.5 STRL STRAW (GLOVE) ×1 IMPLANT
GOWN STRL REUS W/TWL LRG LVL3 (GOWN DISPOSABLE) ×3 IMPLANT
KIT ABG SYR 3ML LUER SLIP (SYRINGE) IMPLANT
NDL HYPO 25X5/8 SAFETYGLIDE (NEEDLE) IMPLANT
NEEDLE HYPO 25X5/8 SAFETYGLIDE (NEEDLE) IMPLANT
NS IRRIG 1000ML POUR BTL (IV SOLUTION) ×1 IMPLANT
PACK C SECTION WH (CUSTOM PROCEDURE TRAY) ×1 IMPLANT
PAD ABD 7.5X8 STRL (GAUZE/BANDAGES/DRESSINGS) IMPLANT
PAD OB MATERNITY 4.3X12.25 (PERSONAL CARE ITEMS) ×1 IMPLANT
PENCIL SMOKE EVAC W/HOLSTER (ELECTROSURGICAL) IMPLANT
RTRCTR C-SECT PINK 25CM LRG (MISCELLANEOUS) ×1 IMPLANT
STRIP CLOSURE SKIN 1/2X4 (GAUZE/BANDAGES/DRESSINGS) IMPLANT
SUT MNCRL 0 VIOLET CTX 36 (SUTURE) ×2 IMPLANT
SUT MONOCRYL 0 CTX 36 (SUTURE) ×2
SUT VIC AB 0 CTX 36 (SUTURE) ×2
SUT VIC AB 0 CTX36XBRD ANBCTRL (SUTURE) ×1 IMPLANT
SUT VIC AB 2-0 CT1 27 (SUTURE) ×1
SUT VIC AB 2-0 CT1 TAPERPNT 27 (SUTURE) ×1 IMPLANT
SUT VIC AB 4-0 KS 27 (SUTURE) ×1 IMPLANT
TOWEL OR 17X24 6PK STRL BLUE (TOWEL DISPOSABLE) ×1 IMPLANT
TRAY FOLEY W/BAG SLVR 14FR LF (SET/KITS/TRAYS/PACK) ×1 IMPLANT
WATER STERILE IRR 1000ML POUR (IV SOLUTION) ×1 IMPLANT

## 2021-10-19 NOTE — Progress Notes (Signed)
Labor Progress Note Dawn Richmond is a 31 y.o. G4P1021 at [redacted]w[redacted]d presented for TOLAC due to decreased to absent fetal movement. S: Patient is resting comfortably. Epidural in place,  O:  BP 118/68   Pulse 89   Temp 98.1 F (36.7 C) (Oral)   Resp 16   Ht 4\' 11"  (1.499 m)   Wt 63 kg   LMP 01/05/2021   SpO2 99%   BMI 28.05 kg/m  EFM: 140/moderate variability/accels present  CVE: Dilation: 6 Effacement (%): 90 Station: -1 Presentation: Vertex Exam by:: [email or web address], CNM   A&P: 31 y.o. 32 [redacted]w[redacted]d here for induction TOLAC due to decreased fetal movement. #Labor: Progressing well. IUPC placed. Has had progression, will not add pitocin for now. #Pain: Epidural in place. #FWB: Cat 1 #GBS positive, PCN first dose administered   [redacted]w[redacted]d, MD, PGY-1 [facility] 9:11 AM 10/19/2021

## 2021-10-19 NOTE — Progress Notes (Signed)
Patient ID: Dawn Richmond, female   DOB: July 22, 1990, 31 y.o.   MRN: [id number] Doing better after resting baby off Pitocin  Vitals:   10/19/21 0037 10/19/21 0038 10/19/21 0156 10/19/21 0303  BP:  123/71 100/61 114/71  Pulse:  84 82 88  Resp: 16  15   Temp: 98.1 F (36.7 C)     TempSrc: Oral     SpO2:      Weight:      Height:       FHR now reactive with no further variable decels Accels present and good baseline variabiliyt  UCs frequent on 57mu/min  Will continue to observe

## 2021-10-19 NOTE — Progress Notes (Signed)
Labor Progress Note Dawn Richmond is a 31 y.o. G4P1021 at [redacted]w[redacted]d presented for TOLAC due to decreased to absent fetal movement since night of  10/17/21.   S: Dawn Richmond is mildly uncomfortable. Has started to feel the contractions more.   O:  BP 100/61   Pulse 82   Temp 98.1 F (36.7 C) (Oral)   Resp 15   Ht 4\' 11"  (1.499 m)   Wt 63 kg   LMP 01/05/2021   SpO2 100% Comment: room air  BMI 28.05 kg/m  EFM: 145/moderate variability/(+) acels, more frequent variables  CVE: Dilation: 2.5 Effacement (%): 40 Station: -3 Presentation: Vertex Exam by:: Dr. [email or web address]   A&P: 31 y.o. 32 [redacted]w[redacted]d presenting for TOLAC due to decreased fetal movement #Labor: Patient is now uncomfortable feeling contractions. Variable decels increased in frequency with pitocin at 43mu/min. 30 minute pitocin break with 11m fluid bolus. Will re-assess at next cervical check.  #Pain: Well controlled. Planning on eventual epidural #FWB: Cat II with occasional variables that resolve with position change.  #GBS positive (receiving PCN)  Vidya Bamford , MD Resident Physician 2:37 AM

## 2021-10-19 NOTE — Anesthesia Preprocedure Evaluation (Addendum)
Anesthesia Evaluation  Patient identified by MRN, date of birth, ID band Patient awake    Reviewed: Allergy & Precautions, NPO status , Patient's Chart, lab work & pertinent test results  History of Anesthesia Complications Negative for: history of anesthetic complications  Airway Mallampati: II  TM Distance: >3 FB Neck ROM: Full    Dental no notable dental hx.    Pulmonary asthma , former smoker,    Pulmonary exam normal        Cardiovascular negative cardio ROS Normal cardiovascular exam     Neuro/Psych Anxiety Depression negative neurological ROS     GI/Hepatic negative GI ROS, Neg liver ROS,   Endo/Other  negative endocrine ROS  Renal/GU negative Renal ROS  negative genitourinary   Musculoskeletal negative musculoskeletal ROS (+)   Abdominal   Peds  Hematology negative hematology ROS (+)   Anesthesia Other Findings Day of surgery medications reviewed with patient.  Reproductive/Obstetrics (+) Pregnancy (hx of C/S x1)                            Anesthesia Physical Anesthesia Plan  ASA: 3  Anesthesia Plan: Epidural   Post-op Pain Management:    Induction:   PONV Risk Score and Plan: Treatment may vary due to age or medical condition  Airway Management Planned: Natural Airway  Additional Equipment: Fetal Monitoring  Intra-op Plan:   Post-operative Plan:   Informed Consent: I have reviewed the patients History and Physical, chart, labs and discussed the procedure including the risks, benefits and alternatives for the proposed anesthesia with the patient or authorized representative who has indicated his/her understanding and acceptance.       Plan Discussed with:   Anesthesia Plan Comments: (Labor epidural > Csection)       Anesthesia Quick Evaluation

## 2021-10-19 NOTE — Transfer of Care (Signed)
Immediate Anesthesia Transfer of Care Note  Patient: Dawn Richmond  Procedure(s) Performed: CESAREAN SECTION  Patient Location: PACU  Anesthesia Type:Epidural  Level of Consciousness: awake, alert  and oriented  Airway & Oxygen Therapy: Patient Spontanous Breathing  Post-op Assessment: Report given to RN and Post -op Vital signs reviewed and stable  Post vital signs: Reviewed and stable  Last Vitals:  Vitals Value Taken Time  BP 98/68 10/19/21 1415  Temp    Pulse 102 10/19/21 1418  Resp 21 10/19/21 1418  SpO2 98 % 10/19/21 1418  Vitals shown include unvalidated device data.  Last Pain:  Vitals:   10/19/21 1208  TempSrc: Axillary  PainSc:       Patients Stated Pain Goal: 0 (10/19/21 0407)  Complications: No notable events documented.

## 2021-10-19 NOTE — Progress Notes (Signed)
Patient ID: Dawn Richmond, female   DOB: 08-11-1990, 31 y.o.   MRN: [id number] Called to room for prolonged variable decel lasting 2 minutes  Vitals:   10/19/21 0449 10/19/21 0451 10/19/21 0452 10/19/21 0456  BP: 104/68 (!) 91/57 (!) 91/57   Pulse: 88 (!) 136 (!) 136   Resp:      Temp:      TempSrc:      SpO2:  100% 100% 100%  Weight:      Height:       FHR now reassuring with good variabiilty  Terbutaline given to rest baby  Epidural was not working well but is now working better.

## 2021-10-19 NOTE — Op Note (Signed)
Dawn Dawn PROCEDURE DATE: 10/19/2021  PREOPERATIVE DIAGNOSES: Intrauterine pregnancy at [redacted]w[redacted]d weeks gestation; non-reassuring fetal status and evolving uterine rupture  POSTOPERATIVE DIAGNOSES: The same, viable infant delivered  PROCEDURE: RepeatLow Transverse Cesarean Section  SURGEON:  Dr. Candelaria Celeste  ASSISTANT:  Myrtie Hawk, DO An experienced assistant was required given the standard of surgical care given the complexity of the case.  This assistant was needed for exposure, dissection, suctioning, retraction, instrument exchange, assisting with delivery with administration of fundal pressure, and for overall help during the procedure.  ANESTHESIOLOGY TEAM: Anesthesiologist: Leilani Able, MD; Kaylyn Layer, MD CRNA: Elbert Ewings, CRNA  INDICATIONS: Dawn Dawn is a 31 y.o. [phone] at [redacted]w[redacted]d here for cesarean section secondary to the indications listed under preoperative diagnoses; please see preoperative note for further details.  The risks of surgery were discussed with the patient including but were not limited to: bleeding which may require transfusion or reoperation; infection which may require antibiotics; injury to bowel, bladder, ureters or other surrounding organs; injury to the fetus; need for additional procedures including hysterectomy in the event of a life-threatening hemorrhage; formation of adhesions; placental abnormalities wth subsequent pregnancies; incisional problems; thromboembolic phenomenon and other postoperative/anesthesia complications.  The patient concurred with the proposed plan, giving informed written consent for the procedure.    FINDINGS:  Viable female infant in cephalic presentation.  Apgars 7 and 9.  Amniotic fluid: clear.  Intact placenta, three vessel cord.  Normal uterus, fallopian tubes and ovaries bilaterally.  ANESTHESIA: epidural INTRAVENOUS FLUIDS: 1800 ml   ESTIMATED BLOOD LOSS: 302 ml URINE OUTPUT:  100 ml SPECIMENS:  Placenta sent to L&D . COMPLICATIONS: None immediate  PROCEDURE IN DETAIL:  The patient preoperatively received intravenous antibiotics and had sequential compression devices applied to her lower extremities.  She was then taken to the operating room where the epidural was dosed up to a surgical level and found to be adequate. She was then placed in a dorsal supine position with a leftward tilt, and prepped and draped in a sterile manner.  A foley catheter was  was already in place.  After an adequate timeout was performed, a Pfannenstiel skin incision was made with scalpel and carried through to the underlying layer of fascia. The fascia was incised in the midline, and this incision was extended bluntly. The rectus muscles were separated in the midline and the peritoneum was entered bluntly.   The Alexis self-retaining retractor was introduced into the abdominal cavity.  Attention was turned to the lower uterine segment where the external uterine wall was noted to be bleeding. A low transverse hysterotomy was made with a scalpel and extended bluntly in caudad and cephalad directions.  The infant was successfully delivered, the cord was clamped and cut immediately, and the infant was handed over to the awaiting neonatology team. Uterine massage was then administered, and the placenta delivered intact with a three-vessel cord. The uterus was then cleared of clots and debris.  The hysterotomy was closed with 0-Vicryl in a running fashion.  A second imbricating layer was placed with 0- Vicryl in running fashion.  The pelvis was cleared of all clot and debris. Hemostasis was confirmed on all surfaces. The uterus was once again inspected and found to be hemostatic. The retractor was removed.  The peritoneum was closed with a 2-0 Vicryl running stitch. The fascia was then closed using 0 Vicryl in a running fashion.  The subcutaneous layer was irrigated, any areas of bleeding were cauterized with the  bovie,  was  found to be hemostatic.. The skin was closed with a 4-0 Vicryl subcuticular stitch. The patient tolerated the procedure well. Sponge, instrument and needle counts were correct x 3.  She was taken to the recovery room in stable condition.   Myrtie Hawk, DO FMOB Fellow, Faculty practice [facility], Center for [facility] 10/19/21  2:01 PM

## 2021-10-19 NOTE — Discharge Summary (Signed)
Postpartum Discharge Summary     Patient Name: Dawn Richmond DOB: May 21, 1990 MRN: [id number]  Date of admission: 10/18/2021 Delivery date:10/19/2021  Delivering provider: Truett Mainland  Date of discharge: 10/22/2021  Admitting diagnosis: Indication for care in labor and delivery, antepartum [O75.9] Intrauterine pregnancy: [redacted]w[redacted]d     Secondary diagnosis:  Active Problems:   History of cesarean section   Group B streptococcal infection in pregnancy   Moderate major depression, single episode ([place])   Post-traumatic stress disorder, chronic   Status post cesarean section   Uterine rupture during labor  Additional problems: n/a    Discharge diagnosis: Term Pregnancy Delivered                                              Post partum procedures: n/a Augmentation: Pitocin Complications: Uterine Rupture  Hospital course: Onset of Labor With Unplanned C/S   31 y.o. yo [id number] at [redacted]w[redacted]d was admitted in Latent Labor on 10/18/2021. Patient had a labor course significant for variable decelerations despite terbutaline administration and pitocin cessation. Then had persistent sharp pelvic pain at her prior c/s incision site. The patient went for cesarean section due to Non-Reassuring FHR and concern for uterine rupture . Delivery details as follows: Membrane Rupture Time/Date: 4:02 AM ,10/19/2021   Delivery Method:C-Section, Low Transverse  Details of operation can be found in separate operative note. Patient had an uncomplicated postpartum course.  She is ambulating,tolerating a regular diet, passing flatus, and urinating well.  Patient is discharged home in stable condition 10/22/21.  Newborn Data: Birth date:10/19/2021  Birth time:1:23 PM  Gender:Female  Living status:Living  Apgars:7 ,9  Weight:3250 g   Magnesium Sulfate received: No BMZ received: No Rhophylac:N/A MMR:N/A T-DaP:Given prenatally Flu: N/A Transfusion:No  Physical exam  Vitals:   10/21/21 1534 10/21/21 2047 10/22/21 0539  10/22/21 0931  BP: 108/69 117/68 (!) 89/59 110/69  Pulse: 78 77 83 95  Resp: $Remo'16 16 17   'lTuZK$ Temp: 98.2 F (36.8 C) 98.7 F (37.1 C) 97.9 F (36.6 C)   TempSrc: Oral Oral Oral   SpO2: 100% 100% 99% 99%  Weight:      Height:       General: alert, cooperative, and no distress Lochia: appropriate Uterine Fundus: firm Incision: Healing well with no significant drainage DVT Evaluation: No evidence of DVT seen on physical exam. Labs: Lab Results  Component Value Date   WBC 22.3 (H) 10/20/2021   HGB 9.5 (L) 10/20/2021   HCT 28.8 (L) 10/20/2021   MCV 83.7 10/20/2021   PLT 172 10/20/2021      Latest Ref Rng & Units 01/25/2012    5:38 AM  CMP  Glucose 70 - 99 mg/dL 107   BUN 6 - 23 mg/dL 6   Creatinine 0.50 - 1.10 mg/dL 0.66   Sodium 135 - 145 mEq/L 137   Potassium 3.5 - 5.1 mEq/L 3.4   Chloride 96 - 112 mEq/L 102   CO2 19 - 32 mEq/L 27   Calcium 8.4 - 10.5 mg/dL 9.4    Edinburgh Score:    10/20/2021    3:27 AM  Edinburgh Postnatal Depression Scale Screening Tool  I have been able to laugh and see the funny side of things. 2  I have looked forward with enjoyment to things. 2  I have blamed myself unnecessarily when things went wrong. 2  I have  been anxious or worried for no good reason. 1  I have felt scared or panicky for no good reason. 2  Things have been getting on top of me. 3  I have been so unhappy that I have had difficulty sleeping. 3  I have felt sad or miserable. 3  I have been so unhappy that I have been crying. 1  The thought of harming myself has occurred to me. 0  Edinburgh Postnatal Depression Scale Total 19     After visit meds:  Allergies as of 10/22/2021   No Known Allergies      Medication List     TAKE these medications    acetaminophen 500 MG tablet Commonly known as: TYLENOL Take 2 tablets (1,000 mg total) by mouth every 6 (six) hours.   ferrous sulfate 325 (65 FE) MG tablet Take 1 tablet (325 mg total) by mouth daily. Start taking on:  October 23, 2021   ibuprofen 600 MG tablet Commonly known as: ADVIL Take 1 tablet (600 mg total) by mouth every 6 (six) hours.   oxyCODONE 5 MG immediate release tablet Commonly known as: Oxy IR/ROXICODONE Take 1 tablet (5 mg total) by mouth every 6 (six) hours as needed for severe pain.   pimecrolimus 1 % cream Commonly known as: ELIDEL Apply 1 Application topically 2 (two) times daily.   pimecrolimus 1 % cream Commonly known as: ELIDEL Apply 1 Application topically 2 (two) times daily.   prenatal vitamin w/FE, FA 27-1 MG Tabs tablet Take 1 tablet by mouth daily at 12 noon.         Discharge home in stable condition Infant Feeding: Breast Infant Disposition:home with mother Discharge instruction: per After Visit Summary and Postpartum booklet. Activity: Advance as tolerated. Pelvic rest for 6 weeks.  Diet: routine diet Future Appointments: Future Appointments  Date Time Provider [place]  10/26/2021  2:00 PM [facility] NURSE [facility]  11/15/2021 10:35 AM Mercado-Ortiz, Ernestine Conrad, DO [facility]   Follow up Visit:  Message sent to [facility] on 10/22/21-- Naaman Plummer Autry-Lott, DO Please schedule this patient for a In person postpartum visit in 6 weeks with the following provider: Any provider. Additional Postpartum F/U:Incision check 1 week  Low risk pregnancy complicated by:  n/a Delivery mode:  C-Section, Low Transverse  Anticipated Birth Control:   none   10/22/2021 Jett Fukuda Autry-Lott, DO

## 2021-10-19 NOTE — Anesthesia Postprocedure Evaluation (Signed)
Anesthesia Post Note  Patient: Dawn Richmond  Procedure(s) Performed: CESAREAN SECTION     Patient location during evaluation: PACU Anesthesia Type: Epidural Level of consciousness: awake Pain management: pain level controlled Vital Signs Assessment: post-procedure vital signs reviewed and stable Respiratory status: spontaneous breathing Cardiovascular status: stable Postop Assessment: no headache, no backache, epidural receding, patient able to bend at knees and no apparent nausea or vomiting Anesthetic complications: no   No notable events documented.  Last Vitals:  Vitals:   10/19/21 1445 10/19/21 1500  BP: (!) 113/56 114/65  Pulse: (!) 102 (!) 101  Resp: 18 20  Temp: 36.9 C   SpO2: 97% 97%    Last Pain:  Vitals:   10/19/21 1500  TempSrc:   PainSc: 0-No pain   Pain Goal: Patients Stated Pain Goal: 0 (10/19/21 0407)  LLE Motor Response: Purposeful movement (10/19/21 1500) LLE Sensation: Tingling (10/19/21 1500) RLE Motor Response: Purposeful movement (10/19/21 1500) RLE Sensation: Tingling (10/19/21 1500)     Epidural/Spinal Function Cutaneous sensation: Tingles (10/19/21 1500), Patient able to flex knees: Yes (10/19/21 1500), Patient able to lift hips off bed: Yes (10/19/21 1500), Back pain beyond tenderness at insertion site: No (10/19/21 1500), Progressively worsening motor and/or sensory loss: No (10/19/21 1500), Bowel and/or bladder incontinence post epidural: No (10/19/21 1500)  Caren Macadam

## 2021-10-19 NOTE — Anesthesia Procedure Notes (Signed)
Epidural Patient location during procedure: OB Start time: 10/19/2021 4:25 AM End time: 10/19/2021 4:28 AM  Staffing Anesthesiologist: Kaylyn Layer, MD Performed: anesthesiologist   Preanesthetic Checklist Completed: patient identified, IV checked, risks and benefits discussed, monitors and equipment checked, pre-op evaluation and timeout performed  Epidural Patient position: sitting Prep: DuraPrep and site prepped and draped Patient monitoring: continuous pulse ox, blood pressure and heart rate Approach: midline Location: L3-L4 Injection technique: LOR air  Needle:  Needle type: Tuohy  Needle gauge: 17 G Needle length: 9 cm Needle insertion depth: 3.5 cm Catheter type: closed end flexible Catheter size: 19 Gauge Catheter at skin depth: 8 cm Test dose: negative and Other (1% lidocaine)  Assessment Events: blood not aspirated, injection not painful, no injection resistance, no paresthesia and negative IV test  Additional Notes Patient identified. Risks, benefits, and alternatives discussed with patient including but not limited to bleeding, infection, nerve damage, paralysis, failed block, incomplete pain control, headache, blood pressure changes, nausea, vomiting, reactions to medication, itching, and postpartum back pain. Confirmed with bedside nurse the patient's most recent platelet count. Confirmed with patient that they are not currently taking any anticoagulation, have any bleeding history, or any family history of bleeding disorders. Patient expressed understanding and wished to proceed. All questions were answered. Sterile technique was used throughout the entire procedure. Please see nursing notes for vital signs.   Crisp LOR on first pass. Test dose was given through epidural catheter and negative prior to continuing to dose epidural or start infusion. Warning signs of high block given to the patient including shortness of breath, tingling/numbness in hands, complete  motor block, or any concerning symptoms with instructions to call for help. Patient was given instructions on fall risk and not to get out of bed. All questions and concerns addressed with instructions to call with any issues or inadequate analgesia.  Reason for block:procedure for pain

## 2021-10-19 NOTE — Lactation Note (Addendum)
This note was copied from a baby's chart. Lactation Consultation Note  Patient Name: Dawn Richmond SAYTK'Z Date: 10/19/2021 Reason for consult: Initial assessment;Term (C/S delivery see Birth Parent- MR.) Age:32 hours, P2- female infant. LC entered the room, Per Birth Parent infant latched at 2015 pm but was on and off breast for 22 minutes. Per Birth Parent, infant did not latch in recovery. Birth Parent will continue to BF infant according to hunger cues, on demand, 8 to 12+ times within 24 hours, STS. Birth Parent knows to call RN/LC for further latch assistance if needed. LC discussed maternal rest, diet and hydration with Birth Parent. Birth Parent was made aware of O/P services, breastfeeding support groups, community resources, and our phone # for post-discharge questions.   Maternal Data Has patient been taught Hand Expression?: Yes Does the patient have breastfeeding experience prior to this delivery?: Yes How long did the patient breastfeed?: Per Birth Parent, she BF 1st child for 6 weeks but stopped due latch difficulties.  Feeding Mother's Current Feeding Choice: Breast Milk  LATCH Score                    Lactation Tools Discussed/Used    Interventions Interventions: Breast feeding basics reviewed;Skin to skin;Breast compression;Position options;Education;LC Services brochure;Hand express  Discharge Pump: Personal (Per Birth Parent she has DEBP at home.)  Consult Status Consult Status: Follow-up Date: 10/20/21 Follow-up type: In-patient    Danelle Earthly 10/19/2021, 9:01 PM

## 2021-10-19 NOTE — Progress Notes (Signed)
Labor Progress Note Jurney Overacker is a 31 y.o. G4P1021 at [redacted]w[redacted]d presented for TOLAC due to decreased to absent fetal movement since night of  10/17/21.   S: Dawn Richmond is resting comfortably. Still not feeling contractions.   O:  BP 123/71   Pulse 84   Temp 98.1 F (36.7 C) (Oral)   Resp 16   Ht 4\' 11"  (1.499 m)   Wt 63 kg   LMP 01/05/2021   SpO2 100% Comment: room air  BMI 28.05 kg/m  EFM: 155/moderate variability/(+) acels, occasional variable decelerations  CVE: Dilation: 2.5 Effacement (%): 40 Station: -3 Presentation: Vertex Exam by:: Dr. [email or web address]   A&P: 31 y.o. 32 [redacted]w[redacted]d presenting for TOLAC due to decreased fetal movement #Labor: Progressing well. Cervical exam unchanged from previous. Discussed starting pitocin. Patient in agreement. Will start 2x2. Anticipate VBAC. #Pain: Well controlled. Planning on eventual epidural #FWB: Cat II with occasional variables that resolve with position change.  #GBS positive (receiving PCN)  Devynn Hessler [redacted]w[redacted]d, MD Resident Physician 12:40 AM

## 2021-10-19 NOTE — Progress Notes (Signed)
Patient ID: Dawn Richmond, female   DOB: 03-22-1990, 31 y.o.   MRN: [id number]  Patient having deep variables and increasing lower abdominal pain despite epidural. Concerns of developing uterine rupture.  Discussed situation with patient - not able to increase pitocin.  The risks of cesarean section discussed with the patient included but were not limited to: bleeding which may require transfusion or reoperation; infection which may require antibiotics; injury to bowel, bladder, ureters or other surrounding organs; injury to the fetus; need for additional procedures including hysterectomy in the event of a life-threatening hemorrhage; placental abnormalities wth subsequent pregnancies, incisional problems, thromboembolic phenomenon and other postoperative/anesthesia complications. The patient concurred with the proposed plan, giving informed written consent for the procedure.   Patient has been NPO since last night, she will remain NPO for procedure. Anesthesia and OR aware.  Preoperative prophylactic Ancef and azithromycin ordered on call to the OR.  To OR when ready.  Levie Heritage, DO 10/19/2021 12:48 PM

## 2021-10-20 ENCOUNTER — Other Ambulatory Visit: Payer: Self-pay

## 2021-10-20 LAB — CBC
HCT: 28.8 % — ABNORMAL LOW (ref 36.0–46.0)
Hemoglobin: 9.5 g/dL — ABNORMAL LOW (ref 12.0–15.0)
MCH: 27.6 pg (ref 26.0–34.0)
MCHC: 33 g/dL (ref 30.0–36.0)
MCV: 83.7 fL (ref 80.0–100.0)
Platelets: 172 10*3/uL (ref 150–400)
RBC: 3.44 MIL/uL — ABNORMAL LOW (ref 3.87–5.11)
RDW: 14.3 % (ref 11.5–15.5)
WBC: 22.3 10*3/uL — ABNORMAL HIGH (ref 4.0–10.5)
nRBC: 0 % (ref 0.0–0.2)

## 2021-10-20 MED ORDER — FERROUS SULFATE 325 (65 FE) MG PO TABS
325.0000 mg | ORAL_TABLET | Freq: Every day | ORAL | Status: DC
Start: 2021-10-20 — End: 2021-10-22
  Administered 2021-10-20 – 2021-10-22 (×3): 325 mg via ORAL
  Filled 2021-10-20 (×3): qty 1

## 2021-10-20 NOTE — Social Work (Signed)
CSW received consult for hx of Anxiety, Depression and PTSD.  CSW met with MOB to offer support and complete assessment. CSW entered the room introduced self, CSW role and reason for visit. MOB was agreeable to visit. CSW observed MOB up in bed holding the infant. MOB was polite and welcoming. CSW inquired about how MOB was feeling, MOB explained her delivery did not go as expected and she was tired but other than that she was feeling good. CSW congratulated MOB on infant "Emmanuel". CSW inquired about MOB MH hx. MOB reported she was diagnosed in 2022 with Anxiety depression and PTSD. CSW inquired about treatment, MOB reported [facility] MOB also explained she was prescribed Zoloft but did  not want to take it during the pregnancy so she is going to start the medication once she gets home. MOB reported she already has the prescription at home. CSW encouraged MOB to continue to follow up with her therapist postpartum. CSW inquired about MOB's mood during the past 7 days, MOB reported she was anxious and worrying about how the delivery would go, MOB reports stable mood now that he is here. CSW assessed for safety, MOB denied and SI, HI or DV. MOB identified her supports asher siblings and close friends. CSW provided education regarding the baby blues period vs. perinatal mood disorders, discussed treatment and gave resources for mental health follow up if concerns arise.  CSW recommends self-evaluation during the postpartum time period using the New Mom Checklist from Postpartum Progress and encouraged MOB to contact a medical professional if symptoms are noted at any time.    CSW provided review of Sudden Infant Death Syndrome (SIDS) precautions. MOB identified [facility] for infants follow up care. MOB reported she has all necessary items for the infant including a crib for him to sleep.     CSW identifies no further need for intervention and no barriers to discharge at this time.     , LCSWA Clinical Social Worker [phone]  

## 2021-10-20 NOTE — Lactation Note (Signed)
This note was copied from a baby's chart. Lactation Consultation Note  Patient Name: Boy Eyva Califano [id number] Date: 10/20/2021 Reason for consult: Follow-up assessment;Term;Infant weight loss (-3% weight loss.) Age:33 hours P2, term female infant. Infant had 4 voids and 6 stools since birth.  Birth Parent feels breastfeeding is going well and infant is BF 15 to 20 minutes most feedings. Birth Parent latched infant in cross cradle hold on her left breast infant, latched with depth  was still BF after 10 minutes when LC left the room.  Birth Parent knows to continue to BF infant according to hunger cues, on demand, 8 to 12+ times STS. If infant is still cuing after latching on the 1st breast to offer the 2nd breast during the same feeding.  Maternal Data    Feeding Mother's Current Feeding Choice: Breast Milk  LATCH Score Latch: Grasps breast easily, tongue down, lips flanged, rhythmical sucking.  Audible Swallowing: Spontaneous and intermittent  Type of Nipple: Everted at rest and after stimulation  Comfort (Breast/Nipple): Soft / non-tender  Hold (Positioning): Assistance needed to correctly position infant at breast and maintain latch.  LATCH Score: 9   Lactation Tools Discussed/Used    Interventions Interventions: Adjust position;Breast compression;Skin to skin;Assisted with latch;Education  Discharge    Consult Status Consult Status: Follow-up Date: 10/21/21 Follow-up type: In-patient    Danelle Earthly 10/20/2021, 6:40 PM

## 2021-10-20 NOTE — Progress Notes (Addendum)
POSTPARTUM PROGRESS NOTE  POD #1  Subjective:  Dawn Richmond is a 31 y.o. [id number] s/p repeat LTCS at [redacted]w[redacted]d.  She reports she doing well. No acute events overnight. She denies any problems with ambulating, voiding or po intake. Denies nausea or vomiting. She has  passed flatus. Pain is well controlled.  Lochia is appropriate.  Objective: Blood pressure (!) 112/59, pulse 92, temperature 98.1 F (36.7 C), temperature source Oral, resp. rate 18, height 4\' 11"  (1.499 m), weight 63 kg, last menstrual period 01/05/2021, SpO2 100 %, unknown if currently breastfeeding.  Physical Exam:  General: alert, cooperative and no distress Chest: no respiratory distress Heart:regular rate, distal pulses intact Abdomen: soft, nontender,  Uterine Fundus: firm, appropriately tender DVT Evaluation: No calf swelling or tenderness Extremities: No edema Skin: warm, dry; incision clean/dry/intact w/ honeycomb dressing in place  Recent Labs    10/19/21 1457 10/20/21 0423  HGB 11.0* 9.5*  HCT 33.0* 28.8*    Assessment/Plan: Dawn Richmond is a 31 y.o. 32 s/p rLTCS at [redacted]w[redacted]d for NRFHT.  POD#1 - Doing welll; pain well controlled. H/H appropriate  Routine postpartum care  OOB, ambulated  Lovenox for VTE prophylaxis Mild Anemia: asymptomatic Start po ferrous sulfate  Contraception: None Feeding: Breast (having trouble with latch)  Dispo: Plan for discharge 10/22/21.   LOS: 2 days   12/22/21, MD Resident Physician 10/20/2021, 7:05 AM   GME ATTESTATION:  I saw and evaluated the patient. I agree with the findings and the plan of care as documented in the resident's note. I have made changes to documentation as necessary.  12/20/2021, DO OB Fellow, Faculty [facility] 10/20/2021, 9:39 AM

## 2021-10-21 NOTE — Progress Notes (Signed)
POSTPARTUM PROGRESS NOTE  POD #2  Subjective:  Dawn Richmond is a 31 y.o. [id number] s/p repeat LTCS at [redacted]w[redacted]d.  She reports she doing well. No acute events overnight. She denies any problems with ambulating, voiding or po intake. Denies nausea or vomiting. She has  passed flatus. Pain is well controlled.  Lochia is appropriate.  Objective: Blood pressure 123/81, pulse 76, temperature 98.1 F (36.7 C), temperature source Oral, resp. rate 16, height 4\' 11"  (1.499 m), weight 63 kg, last menstrual period 01/05/2021, SpO2 100 %, unknown if currently breastfeeding.  Physical Exam:  General: alert, cooperative and no distress Chest: no respiratory distress Heart:regular rate, distal pulses intact Abdomen: soft, nontender,  Uterine Fundus: firm, appropriately tender DVT Evaluation: No calf swelling or tenderness Extremities: No edema Skin: warm, dry; incision clean/dry/intact w/ honeycomb dressing in place  Recent Labs    10/19/21 1457 10/20/21 0423  HGB 11.0* 9.5*  HCT 33.0* 28.8*    Assessment/Plan: Dawn Richmond is a 31 y.o. 32 s/p rLTCS at [redacted]w[redacted]d for NRFHT.  POD#2 - Doing welll; pain well controlled. H/H appropriate  Routine postpartum care  OOB, ambulated  Lovenox for VTE prophylaxis Mild Anemia: asymptomatic Start po ferrous sulfate  Contraception: None Feeding: Breast (having trouble with latch)  Dispo: Plan for discharge 10/22/21.   LOS: 3 days   12/22/21, MD OB fellow 10/21/2021, 7:43 AM

## 2021-10-21 NOTE — Progress Notes (Signed)
  Progress Note   Date: 10/21/2021  Patient Name: Dawn Richmond        MRN#: [id number]  Clarification of diagnosis:  Post-operative (acute) blood loss anemia  Rx with oral ferrous sulfate.

## 2021-10-22 MED ORDER — IBUPROFEN 600 MG PO TABS: 600.0000 mg | ORAL_TABLET | Freq: Four times a day (QID) | ORAL | 0 refills | Status: DC

## 2021-10-22 MED ORDER — FERROUS SULFATE 325 (65 FE) MG PO TABS: 325.0000 mg | ORAL_TABLET | Freq: Every day | ORAL | 0 refills | Status: DC

## 2021-10-22 MED ORDER — OXYCODONE HCL 5 MG PO TABS: 5.0000 mg | ORAL_TABLET | Freq: Four times a day (QID) | ORAL | 0 refills | Status: DC | PRN

## 2021-10-22 MED ORDER — ACETAMINOPHEN 500 MG PO TABS: 1000.0000 mg | ORAL_TABLET | Freq: Four times a day (QID) | ORAL | 0 refills | Status: DC

## 2021-10-22 NOTE — Lactation Note (Addendum)
This note was copied from a baby's chart. Lactation Consultation Note  Patient Name: Dawn Richmond [id number] Date: 10/22/2021 Reason for consult: Follow-up assessment;Mother's request;Difficult latch;Term;Breastfeeding assistance;Infant weight loss Age:33 hours  Infant cluster feeding according to birth parent. Infant adequate urine and stool output to account for 8 % weight loss.  Birth parent nipple trauma from latching bruising but skin intact. Birth parent using EBM for nipple care, will use coconut oil at home.   Plan 1. To feed based on cues 8-12x 24hr period. Birth parent to offer breast with compression and note changes in milk transfer.  2. Supplement with pace bottle feeding and slow flow nipple EBM. BF supplementation guide provided.  3 Post pump after each feeding for 15 mins.   Birth parent full, breast uncomfortable, set up DEBP and felt relief with pumping.  All questions answered at the end of the visit.  Birth parent has 2 electric pumps at home.  Birth parent to call for latch assistance prior to discharge.   Maternal Data Has patient been taught Hand Expression?: Yes  Feeding Mother's Current Feeding Choice: Breast Milk  LATCH Score Latch: Grasps breast easily, tongue down, lips flanged, rhythmical sucking.  Audible Swallowing: Spontaneous and intermittent  Type of Nipple: Everted at rest and after stimulation  Comfort (Breast/Nipple): Soft / non-tender  Hold (Positioning): No assistance needed to correctly position infant at breast.  LATCH Score: 10   Lactation Tools Discussed/Used Tools: Pump;Flanges Flange Size: 24;27 Breast pump type: Double-Electric Breast Pump Pump Education: Setup, frequency, and cleaning;Milk Storage Reason for Pumping: offer supplementation Pumping frequency: post pump afer each feeding for 15 mins  Interventions Interventions: Breast feeding basics reviewed;Skin to skin;Hand express;Breast compression;Position  options;Expressed milk;DEBP;Education;Pace feeding;LC Psychologist, educational;Infant Driven Feeding Algorithm education  Discharge Discharge Education: Engorgement and breast care;Warning signs for feeding baby;Outpatient recommendation;Outpatient Epic message sent Pump: DEBP;Personal WIC Program: No  Consult Status Consult Status: Complete Date: 10/22/21    Meris Reede  Nicholson-Springer 10/22/2021, 1:39 PM

## 2021-10-26 ENCOUNTER — Other Ambulatory Visit: Payer: Self-pay

## 2021-10-26 ENCOUNTER — Ambulatory Visit: Payer: No Typology Code available for payment source

## 2021-10-26 NOTE — Progress Notes (Signed)
Incision Check Visit  Dawn Richmond is here for incision check following repeat c-section on 10/19/2021.   Assessment: Wound is clean, dry and intact.   Education: Reviewed good wound care and s/s of infection with patient. Pt verbalized understanding.  RN reviewed Inocente Salles Depression scale with patient. Patient scored 22. She states she is currently in treatment and is starting back on her Zoloft this week. RN informed patient of behavior health resources with [facility].   Patient will follow up post partum visit .  Janeece Agee, RN 10/26/2021  2:31 PM

## 2021-11-07 ENCOUNTER — Encounter: Payer: Self-pay | Admitting: Radiology

## 2021-11-15 ENCOUNTER — Encounter: Payer: Self-pay | Admitting: Family Medicine

## 2021-11-15 ENCOUNTER — Ambulatory Visit (INDEPENDENT_AMBULATORY_CARE_PROVIDER_SITE_OTHER): Payer: No Typology Code available for payment source | Admitting: Family Medicine

## 2021-11-15 ENCOUNTER — Other Ambulatory Visit: Payer: Self-pay

## 2021-11-15 DIAGNOSIS — B951 Streptococcus, group B, as the cause of diseases classified elsewhere: Secondary | ICD-10-CM

## 2021-11-15 DIAGNOSIS — Z98891 History of uterine scar from previous surgery: Secondary | ICD-10-CM

## 2021-11-15 DIAGNOSIS — Z3480 Encounter for supervision of other normal pregnancy, unspecified trimester: Secondary | ICD-10-CM

## 2021-11-15 DIAGNOSIS — O98819 Other maternal infectious and parasitic diseases complicating pregnancy, unspecified trimester: Secondary | ICD-10-CM

## 2021-11-15 DIAGNOSIS — F53 Postpartum depression: Secondary | ICD-10-CM

## 2021-11-15 DIAGNOSIS — Z0289 Encounter for other administrative examinations: Secondary | ICD-10-CM

## 2021-11-15 NOTE — Progress Notes (Signed)
Post Partum Visit Note  Dawn Richmond is a 31 y.o. [phone] female who presents for a postpartum visit. She is 3 weeks 6 day postpartum following a repeat cesarean section.  Attempted TOLAC, but underwent rLTCS for NRFHT and prior c/s incisional site pain during labor. I have fully reviewed the prenatal and intrapartum course. The delivery was at [redacted]w[redacted]d gestational weeks.  Anesthesia: epidural. Postpartum course has been complicated by postpartum depression, now on sertraline 50mg  for 2 weeks and [facility]. Baby is doing well. Baby is feeding by breast. Bleeding no bleeding. Bowel function is normal. Bladder function is normal. Patient is not sexually active. Contraception method is none, she hopes to start Depo in the future. Postpartum depression screening: positive.   The pregnancy intention screening data noted above was reviewed. Potential methods of contraception were discussed. The patient elected to proceed with No data recorded.   Edinburgh Postnatal Depression Scale - 11/15/21 1112       Edinburgh Postnatal Depression Scale:  In the Past 7 Days   I have been able to laugh and see the funny side of things. 2    I have looked forward with enjoyment to things. 1    I have blamed myself unnecessarily when things went wrong. 2    I have been anxious or worried for no good reason. 2    I have felt scared or panicky for no good reason. 2    Things have been getting on top of me. 2    I have been so unhappy that I have had difficulty sleeping. 1    I have felt sad or miserable. 2    I have been so unhappy that I have been crying. 2    The thought of harming myself has occurred to me. 0    Edinburgh Postnatal Depression Scale Total 16             Health Maintenance Due  Topic Date Due   COVID-19 Vaccine (5 - Moderna series) 04/20/2020   INFLUENZA VACCINE  09/14/2021    The following portions of the patient's history were reviewed and updated as  appropriate: allergies, current medications, past family history, past medical history, past social history, past surgical history, and problem list.  Review of Systems Pertinent items are noted in HPI.  Objective:  BP 119/78   Pulse 83   Wt 113 lb (51.3 kg)   LMP 01/05/2021   Breastfeeding Yes   BMI 22.82 kg/m    General:  alert, cooperative, appears stated age, and fatigued   Breasts:  normal  Lungs: clear to auscultation bilaterally  Heart:  regular rate and rhythm, S1, S2 normal, no murmur, click, rub or gallop  Abdomen: soft, non-tender; bowel sounds normal; no masses,  no organomegaly   Wound well approximated incision  GU exam:  not indicated       Assessment:    1. Postpartum examination following cesarean delivery  2. History of cesarean section  3. Postpartum depression  4. Supervision of other normal pregnancy, antepartum  5. Group B streptococcal infection in pregnancy  6. Status post cesarean section    Abnormal postpartum exam.   Plan:   Essential components of care per ACOG recommendations:  1.  Mood and well being: Patient with positive depression screening today. Reviewed local resources for support.  - Patient tobacco use? No.   - hx of drug use? No.    2. Infant care and  feeding:  -Patient currently breastmilk feeding? Yes. Discussed returning to work and pumping. Reviewed importance of draining breast regularly to support lactation.  -Social determinants of health (SDOH) reviewed in EPIC. No concerns. 3. Sexuality, contraception and birth spacing - Patient does not want a pregnancy in the next year.  - Reviewed reproductive life planning. Reviewed contraceptive methods based on pt preferences and effectiveness.  Patient desired Abstinence and Hormonal Injection in the future.   - Discussed birth spacing of 18 months  4. Sleep and fatigue -Encouraged family/partner/community support of 4 hrs of uninterrupted sleep to help with mood and  fatigue  5. Physical Recovery  - Discussed patients delivery and complications. She describes her labor as good. - Patient had a C-section. Patient had  no  laceration. Perineal healing reviewed. Patient expressed understanding - Patient has urinary incontinence? No. - Patient is safe to resume physical and sexual activity  6.  Health Maintenance - HM due items addressed Yes - Last pap smear 03/10/21. Pap smear not done at today's visit.  -Breast Cancer screening indicated? No.   7. Chronic Disease/Pregnancy Condition follow up: None  - [facility].  Shelda Pal, [place] [facility] [place], [place]

## 2021-12-16 ENCOUNTER — Telehealth: Payer: Self-pay | Admitting: Family Medicine

## 2021-12-16 NOTE — Telephone Encounter (Signed)
Call placed to pt. Spoke with pt. Pt states called wrong office and needs to return call to PCP.  Colletta [place], RNC

## 2021-12-16 NOTE — Telephone Encounter (Signed)
Patient is requesting a  Rx for Birth Control Pills

## 2021-12-20 ENCOUNTER — Telehealth: Payer: Self-pay | Admitting: Family Medicine

## 2021-12-20 ENCOUNTER — Other Ambulatory Visit: Payer: Self-pay | Admitting: Family Medicine

## 2021-12-20 DIAGNOSIS — Z30011 Encounter for initial prescription of contraceptive pills: Secondary | ICD-10-CM

## 2021-12-20 MED ORDER — NORGESTIM-ETH ESTRAD TRIPHASIC 0.18/0.215/0.25 MG-25 MCG PO TABS: 1.0000 | ORAL_TABLET | Freq: Every day | ORAL | 11 refills | Status: DC

## 2021-12-20 NOTE — Telephone Encounter (Signed)
Returned call to pt and discussed her request.  She stated that the doctor had offered birth control during her last visit but she was not ready @ that time. She would now like OCP's to be prescribed.  She denies having sex since her last office appt on 10/2. I advised that I will forward her request to Dr. Clement Husbands.

## 2021-12-20 NOTE — Telephone Encounter (Signed)
Patient called in needing birth control refill she states PCP told her to call her instead.

## 2021-12-21 ENCOUNTER — Telehealth: Payer: Self-pay

## 2021-12-21 NOTE — Telephone Encounter (Signed)
Pt left message on Nurse voicemail regarding her Rx for (ORTHO TRI-CYCLEN LO, Pharmacy advised that she had a $22.00 co-pay. Her insurance advised her that should have not been any co-pay. They told her because it was not submitted as preventive care, that's the reason for the charge. Sent message to Rx Dr. Simmie Davies, DO.

## 2021-12-23 ENCOUNTER — Telehealth: Payer: Self-pay | Admitting: Family Medicine

## 2021-12-23 DIAGNOSIS — Z30011 Encounter for initial prescription of contraceptive pills: Secondary | ICD-10-CM

## 2021-12-23 DIAGNOSIS — Z0000 Encounter for general adult medical examination without abnormal findings: Secondary | ICD-10-CM

## 2021-12-23 MED ORDER — NORGESTIM-ETH ESTRAD TRIPHASIC 0.18/0.215/0.25 MG-25 MCG PO TABS: 1.0000 | ORAL_TABLET | Freq: Every day | ORAL | 11 refills | Status: DC

## 2021-12-23 MED ORDER — NORGESTIMATE-ETH ESTRADIOL 0.25-35 MG-MCG PO TABS: 1.0000 | ORAL_TABLET | Freq: Every day | ORAL | 11 refills | Status: DC

## 2021-12-23 NOTE — Addendum Note (Signed)
Addended by: Kathee Delton on: 12/23/2021 01:44 PM   Modules accepted: Orders

## 2021-12-23 NOTE — Telephone Encounter (Signed)
Patient's pharmacy called stating her insurance is still not wanting to cover the ortho tri cyclen because of the associated diagnosis code. Attempted to change the code but the prescription still wouldn't go through the patient's insurance. Changed Rx to Ortho cyclen and Rx went through insurance without difficulty. Pharmacy will get her prescription ready.

## 2021-12-23 NOTE — Telephone Encounter (Signed)
Patient called stating pharmacy told her that her medication was billed incorrectly and she would have to call back to have it billed correctly. She would like a call back from a nurse.

## 2021-12-23 NOTE — Telephone Encounter (Signed)
Called patient's pharmacy and they state her OCP prescription is ready for pick up so they aren't sure what the problem is. [facility] states they will reach out to the patient and call us back if there is a problem

## 2021-12-24 ENCOUNTER — Telehealth: Payer: Self-pay | Admitting: Family Medicine

## 2021-12-24 NOTE — Telephone Encounter (Signed)
Patient would like a call from nurse to go over any other kind of birth control to be perscribed

## 2021-12-27 ENCOUNTER — Other Ambulatory Visit: Payer: Self-pay | Admitting: Family Medicine

## 2021-12-27 DIAGNOSIS — Z30011 Encounter for initial prescription of contraceptive pills: Secondary | ICD-10-CM

## 2021-12-27 MED ORDER — SLYND 4 MG PO TABS: 1.0000 | ORAL_TABLET | Freq: Every day | ORAL | 12 refills | Status: AC

## 2021-12-27 NOTE — Telephone Encounter (Addendum)
Returned call to pt.  She stated that she obtained her both control and it is a combined hormone pill.  She is still breastfeeding and requests progesterone only pill. Pt was advised that I will contact the provider and will let her know once a response has been received. She voiced understanding.    11/13  1100  Called pt and informed her that a new prescription for progesterone birth control has been sent to her pharmacy. Pt then stated that her pharmacy has contacted her and informed her that the medication requires prior authorization. Dr. Lanae Crumbly was asked if she would like to prescribe alternate and she stated that she would prefer to try and obtain prior auth for this medication (Slynd).

## 2021-12-29 NOTE — Telephone Encounter (Signed)
Called pharmacy who states PA will have to be intiated with [employer] because this is listed as Editor, commissioning. [employer] PA phone number [phone]. PA started: #[id number].

## 2022-01-04 NOTE — Telephone Encounter (Signed)
PA approved. Called pharmacy who states patient has picked up medication with no issue on 12/29/21.
